# Patient Record
Sex: Female | Born: 1970 | Race: White | Hispanic: No | Marital: Married | Smoking: Former smoker
Health system: Southern US, Community
[De-identification: ages and names within clinical notes are randomized; demographics above are authoritative.]

## PROBLEM LIST (undated history)

## (undated) DIAGNOSIS — E7800 Pure hypercholesterolemia, unspecified: Secondary | ICD-10-CM

## (undated) DIAGNOSIS — I10 Essential (primary) hypertension: Secondary | ICD-10-CM

## (undated) DIAGNOSIS — C539 Malignant neoplasm of cervix uteri, unspecified: Secondary | ICD-10-CM

## (undated) HISTORY — PX: HAND SURGERY: SHX662

## (undated) HISTORY — PX: CARPAL TUNNEL RELEASE: SHX101

## (undated) HISTORY — PX: ABDOMINAL HYSTERECTOMY: SHX81

---

## 2005-05-24 ENCOUNTER — Ambulatory Visit: Payer: Self-pay | Admitting: Cardiology

## 2005-05-31 ENCOUNTER — Ambulatory Visit: Payer: Self-pay

## 2005-08-07 ENCOUNTER — Encounter (INDEPENDENT_AMBULATORY_CARE_PROVIDER_SITE_OTHER): Payer: Self-pay | Admitting: Specialist

## 2005-08-08 ENCOUNTER — Observation Stay (HOSPITAL_COMMUNITY): Admission: RE | Admit: 2005-08-08 | Discharge: 2005-08-09 | Payer: Self-pay | Admitting: Obstetrics and Gynecology

## 2005-09-01 ENCOUNTER — Emergency Department (HOSPITAL_COMMUNITY): Admission: EM | Admit: 2005-09-01 | Discharge: 2005-09-01 | Payer: Self-pay | Admitting: Family Medicine

## 2006-01-18 ENCOUNTER — Emergency Department (HOSPITAL_COMMUNITY): Admission: EM | Admit: 2006-01-18 | Discharge: 2006-01-19 | Payer: Self-pay | Admitting: Emergency Medicine

## 2006-10-03 ENCOUNTER — Ambulatory Visit (HOSPITAL_BASED_OUTPATIENT_CLINIC_OR_DEPARTMENT_OTHER): Admission: RE | Admit: 2006-10-03 | Discharge: 2006-10-03 | Payer: Self-pay | Admitting: Orthopedic Surgery

## 2007-03-08 ENCOUNTER — Emergency Department (HOSPITAL_COMMUNITY): Admission: EM | Admit: 2007-03-08 | Discharge: 2007-03-08 | Payer: Self-pay | Admitting: Family Medicine

## 2007-10-26 ENCOUNTER — Emergency Department (HOSPITAL_COMMUNITY): Admission: EM | Admit: 2007-10-26 | Discharge: 2007-10-26 | Payer: Self-pay | Admitting: Emergency Medicine

## 2008-04-26 ENCOUNTER — Ambulatory Visit (HOSPITAL_BASED_OUTPATIENT_CLINIC_OR_DEPARTMENT_OTHER): Admission: RE | Admit: 2008-04-26 | Discharge: 2008-04-26 | Payer: Self-pay | Admitting: Orthopedic Surgery

## 2008-04-26 ENCOUNTER — Encounter (INDEPENDENT_AMBULATORY_CARE_PROVIDER_SITE_OTHER): Payer: Self-pay | Admitting: Orthopedic Surgery

## 2008-11-21 ENCOUNTER — Emergency Department (HOSPITAL_COMMUNITY): Admission: EM | Admit: 2008-11-21 | Discharge: 2008-11-21 | Payer: Self-pay | Admitting: Emergency Medicine

## 2009-07-21 ENCOUNTER — Emergency Department (HOSPITAL_BASED_OUTPATIENT_CLINIC_OR_DEPARTMENT_OTHER): Admission: EM | Admit: 2009-07-21 | Discharge: 2009-07-21 | Payer: Self-pay | Admitting: Emergency Medicine

## 2009-08-24 ENCOUNTER — Encounter (INDEPENDENT_AMBULATORY_CARE_PROVIDER_SITE_OTHER): Payer: Self-pay | Admitting: *Deleted

## 2009-08-30 ENCOUNTER — Encounter: Admission: RE | Admit: 2009-08-30 | Discharge: 2009-08-30 | Payer: Self-pay | Admitting: Gastroenterology

## 2009-08-30 ENCOUNTER — Encounter (INDEPENDENT_AMBULATORY_CARE_PROVIDER_SITE_OTHER): Payer: Self-pay | Admitting: *Deleted

## 2009-12-04 ENCOUNTER — Emergency Department (HOSPITAL_COMMUNITY): Admission: EM | Admit: 2009-12-04 | Discharge: 2009-12-04 | Payer: Self-pay | Admitting: Family Medicine

## 2010-03-18 ENCOUNTER — Ambulatory Visit: Payer: Self-pay | Admitting: Diagnostic Radiology

## 2010-03-18 ENCOUNTER — Emergency Department (HOSPITAL_BASED_OUTPATIENT_CLINIC_OR_DEPARTMENT_OTHER): Admission: EM | Admit: 2010-03-18 | Discharge: 2010-03-18 | Payer: Self-pay | Admitting: Emergency Medicine

## 2010-10-17 ENCOUNTER — Other Ambulatory Visit: Payer: Self-pay | Admitting: Obstetrics and Gynecology

## 2010-11-17 LAB — DIFFERENTIAL
Lymphocytes Relative: 28 % (ref 12–46)
Neutrophils Relative %: 63 % (ref 43–77)

## 2010-11-17 LAB — CBC
HCT: 37.6 % (ref 36.0–46.0)
MCH: 30 pg (ref 26.0–34.0)
MCHC: 34.3 g/dL (ref 30.0–36.0)
Platelets: 346 10*3/uL (ref 150–400)
RBC: 4.3 MIL/uL (ref 3.87–5.11)
RDW: 12.1 % (ref 11.5–15.5)

## 2010-11-17 LAB — URINALYSIS, ROUTINE W REFLEX MICROSCOPIC
Leukocytes, UA: NEGATIVE
Nitrite: NEGATIVE
Protein, ur: 100 mg/dL — AB
Specific Gravity, Urine: 1.018 (ref 1.005–1.030)
Urobilinogen, UA: 0.2 mg/dL (ref 0.0–1.0)

## 2010-11-17 LAB — BASIC METABOLIC PANEL
BUN: 14 mg/dL (ref 6–23)
CO2: 25 mEq/L (ref 19–32)
GFR calc Af Amer: >60 mL/min (ref >60)
Glucose, Bld: 133 mg/dL — ABNORMAL HIGH (ref 70–99)
Potassium: 3.8 mEq/L (ref 3.5–5.1)
Sodium: 144 mEq/L (ref 135–145)

## 2010-11-17 LAB — URINE MICROSCOPIC-ADD ON

## 2010-11-17 LAB — PREGNANCY, URINE: Preg Test, Ur: NEGATIVE

## 2010-11-17 LAB — HEPATIC FUNCTION PANEL: Albumin: 4.5 g/dL (ref 3.5–5.2)

## 2010-11-21 LAB — HERPES SIMPLEX VIRUS CULTURE

## 2010-12-05 LAB — CBC
HCT: 37.6 % (ref 36.0–46.0)
Hemoglobin: 13.3 g/dL (ref 12.0–15.0)
MCHC: 35.4 g/dL (ref 30.0–36.0)
MCV: 86.9 fL (ref 78.0–100.0)
Platelets: 239 10*3/uL (ref 150–400)

## 2010-12-05 LAB — URINALYSIS, ROUTINE W REFLEX MICROSCOPIC
Glucose, UA: NEGATIVE mg/dL
Ketones, ur: NEGATIVE mg/dL
Specific Gravity, Urine: 1.005 (ref 1.005–1.030)

## 2010-12-05 LAB — COMPREHENSIVE METABOLIC PANEL
ALT: 38 U/L — ABNORMAL HIGH (ref 0–35)
Albumin: 4.6 g/dL (ref 3.5–5.2)
BUN: 10 mg/dL (ref 6–23)
CO2: 23 mEq/L (ref 19–32)
Creatinine, Ser: 0.6 mg/dL (ref 0.4–1.2)
Glucose, Bld: 158 mg/dL — ABNORMAL HIGH (ref 70–99)
Potassium: 3.8 mEq/L (ref 3.5–5.1)
Sodium: 139 mEq/L (ref 135–145)

## 2010-12-05 LAB — DIFFERENTIAL
Eosinophils Absolute: 0 10*3/uL (ref 0.0–0.7)
Lymphs Abs: 0.7 10*3/uL (ref 0.7–4.0)
Monocytes Absolute: 0.6 10*3/uL (ref 0.1–1.0)
Neutro Abs: 9 10*3/uL — ABNORMAL HIGH (ref 1.7–7.7)
Neutrophils Relative %: 86 % — ABNORMAL HIGH (ref 43–77)

## 2010-12-13 LAB — POCT I-STAT, CHEM 8
BUN: 5 mg/dL — ABNORMAL LOW (ref 6–23)
Chloride: 105 mEq/L (ref 96–112)
Glucose, Bld: 109 mg/dL — ABNORMAL HIGH (ref 70–99)
Hemoglobin: 13.3 g/dL (ref 12.0–15.0)

## 2010-12-13 LAB — CBC
Hemoglobin: 13.4 g/dL (ref 12.0–15.0)
MCHC: 34.9 g/dL (ref 30.0–36.0)
MCV: 86.9 fL (ref 78.0–100.0)
RDW: 13 % (ref 11.5–15.5)
WBC: 9.2 10*3/uL (ref 4.0–10.5)

## 2010-12-13 LAB — DIFFERENTIAL
Basophils Absolute: 0.1 10*3/uL (ref 0.0–0.1)
Lymphocytes Relative: 27 % (ref 12–46)
Lymphs Abs: 2.5 10*3/uL (ref 0.7–4.0)
Neutro Abs: 5.8 10*3/uL (ref 1.7–7.7)
Neutrophils Relative %: 62 % (ref 43–77)

## 2010-12-13 LAB — URINALYSIS, ROUTINE W REFLEX MICROSCOPIC
Bilirubin Urine: NEGATIVE
Ketones, ur: NEGATIVE mg/dL
Nitrite: NEGATIVE
Protein, ur: NEGATIVE mg/dL

## 2011-01-07 ENCOUNTER — Emergency Department (HOSPITAL_COMMUNITY)
Admission: EM | Admit: 2011-01-07 | Discharge: 2011-01-07 | Disposition: A | Discharge status: Home / Self Care | Payer: 59 | Attending: Emergency Medicine | Admitting: Emergency Medicine

## 2011-01-07 ENCOUNTER — Emergency Department (HOSPITAL_COMMUNITY): Payer: 59

## 2011-01-07 DIAGNOSIS — Z79899 Other long term (current) drug therapy: Secondary | ICD-10-CM | POA: Insufficient documentation

## 2011-01-07 DIAGNOSIS — R0609 Other forms of dyspnea: Secondary | ICD-10-CM | POA: Insufficient documentation

## 2011-01-07 DIAGNOSIS — I10 Essential (primary) hypertension: Secondary | ICD-10-CM | POA: Insufficient documentation

## 2011-01-07 DIAGNOSIS — R002 Palpitations: Secondary | ICD-10-CM | POA: Insufficient documentation

## 2011-01-07 DIAGNOSIS — R110 Nausea: Secondary | ICD-10-CM | POA: Insufficient documentation

## 2011-01-07 DIAGNOSIS — E119 Type 2 diabetes mellitus without complications: Secondary | ICD-10-CM | POA: Insufficient documentation

## 2011-01-07 DIAGNOSIS — M542 Cervicalgia: Secondary | ICD-10-CM | POA: Insufficient documentation

## 2011-01-07 DIAGNOSIS — R0989 Other specified symptoms and signs involving the circulatory and respiratory systems: Secondary | ICD-10-CM | POA: Insufficient documentation

## 2011-01-07 DIAGNOSIS — M79609 Pain in unspecified limb: Secondary | ICD-10-CM | POA: Insufficient documentation

## 2011-01-07 DIAGNOSIS — R079 Chest pain, unspecified: Secondary | ICD-10-CM | POA: Insufficient documentation

## 2011-01-07 LAB — URINALYSIS, ROUTINE W REFLEX MICROSCOPIC
Ketones, ur: NEGATIVE mg/dL
Nitrite: NEGATIVE
Protein, ur: NEGATIVE mg/dL
pH: 7 (ref 5.0–8.0)

## 2011-01-07 LAB — CBC
Platelets: 332 10*3/uL (ref 150–400)
RDW: 12.8 % (ref 11.5–15.5)
WBC: 9.6 10*3/uL (ref 4.0–10.5)

## 2011-01-07 LAB — DIFFERENTIAL
Basophils Absolute: 0.1 10*3/uL (ref 0.0–0.1)
Eosinophils Absolute: 0.2 10*3/uL (ref 0.0–0.7)
Eosinophils Relative: 2 % (ref 0–5)
Lymphocytes Relative: 33 % (ref 12–46)

## 2011-01-07 LAB — POCT CARDIAC MARKERS
CKMB, poc: <1 ng/mL — ABNORMAL LOW (ref 1.0–8.0)
CKMB, poc: <1 ng/mL — ABNORMAL LOW (ref 1.0–8.0)
Myoglobin, poc: 44.6 ng/mL (ref 12–200)
Troponin i, poc: <0.05 ng/mL (ref 0.00–0.09)

## 2011-01-07 LAB — COMPREHENSIVE METABOLIC PANEL
BUN: 9 mg/dL (ref 6–23)
Calcium: 9.8 mg/dL (ref 8.4–10.5)
Glucose, Bld: 111 mg/dL — ABNORMAL HIGH (ref 70–99)
Total Protein: 7.3 g/dL (ref 6.0–8.3)

## 2011-01-07 LAB — LIPASE, BLOOD: Lipase: 41 U/L (ref 11–59)

## 2011-01-15 NOTE — Op Note (Signed)
Marie Roberts, Marie Roberts               ACCOUNT NO.:  [id number]   MEDICAL RECORD NO.:  [id number]          PATIENT TYPE:  AMB   LOCATION:  DSC                          FACILITY:  [facility]   PHYSICIAN:  Katy Fitch. Sypher, M.D. 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   POSTOPERATIVE DIAGNOSES:  1. Avascular necrosis, proximal pole, left scaphoid.  2. Grade 4 chondromalacia, proximal pole of scaphoid, lunate, and      central portion of triquetrum with intact hyaline articular      cartilage surfaces of proximal capitate and lunate leading to      decision to proceed with proximal row carpectomy as a salvage      rather than a scaphoid excision and ulnar four-bone fusion      procedure.   OPERATION:  Exploration of left radiocarpal, ulnocarpal, and midcarpal  joints followed by identification of significant radiolunate arthrosis  and proximal triquetral arthrosis aborting the decision to proceed with  a scaphoid excision and ulnar four-bone fusion.  Ultimately, a proximal  row carpectomy is performed with synovectomy and posterior interosseous  neurectomy of the left wrist.   OPERATING SURGEON:  Katy Fitch. Sypher, MD   ASSISTANT:  Marveen Reeks Dasnoit, PA-C   ANESTHESIA:  General by LMA.   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  She has been a long-term patient.  She is status  post prior carpal tunnel release.  She returned with increasing wrist  pain and on clinical examination, was noted to have loss of range of  motion.  Followup electrodiagnostic studies on December 02, 2007, revealed  no evidence of carpal tunnel  syndrome.  X-rays of the wrist documented a  chronic scaphoid nonunion and advanced collapse deformity of the left  wrist.   She has failed nonoperative measures including splinting, activity  modification, and anti-inflammatory medication.  Due to failure to  respond to these measures, she is brought to the operating at this time.   Given the pattern of her x-ray findings, we initially recommended a  scaphoid excision and ulnar four-bone fusion.  She was advised, however,  that we would address her pathology appropriately for the findings noted  at surgery.   After informed consent, she is brought to the operating at this time.   PROCEDURE:  Marie Roberts was brought to the operating room and  placed in supine position on the operating table.   Following the induction of general anesthesia by LMA technique under Dr.  Edison Pace direct supervision, the left arm was prepped with Betadine  soap solution and sterilely draped.  Ancef 1g was administered as an IV  prophylactic antibiotic.   The left arm was prepped with Betadine soap solution and sterilely  draped.  Following exsanguination of left arm with  Esmarch bandage, an  arterial tourniquet on the proximal brachium was inflated to 220 mmHg.   Procedure commenced with a standard dorsal incision extending from the  base of the long finger metacarpal to Lister tubercle ulnar aspect.  The  subcutaneous tissues were carefully divided taking care to spare the  dorsal veins.  The extensor retinaculum was split over 1 cm distance and  the extensors retracted in a ulnar direction.  A transverse capsulotomy  was performed exposing the scaphoid nonunion.  The proximal pole of the  scaphoid was saponified and avascular.  The proximal pole was removed as  a single fragment with a rongeur.  The distal pole was also removed as a  single fragment after release of ligaments with a curved periosteal  elevator.  The proximal pole of the  scaphoid was denuded of hyaline  cartilage and there was considerable hyaline cartilage loss on the  distal pole of the scaphoid.  The lunate facet of the radius was  examined and found to have intact hyaline cartilage.  The lunate was  carefully examined and the dorsal half of the lunate had grade 4  chondromalacia with eburnated bone.  The proximal pole of the capitate  had intact hyaline cartilage on its load-bearing surface.  The facet  that had articulated with the proximal pole of the scaphoid dorsally did  have grade 3 chondromalacia.  The proximal aspect of the triquetrum was  inspected and found to have grade 4 chondromalacia over 40% of its  surface proximally.   Given these circumstances, the approach of using a ulnar four-bone  fusion was no longer valid.   Given the circumstance, we proceeded to a proximal row carpectomy.   The lunate and triquetrum were morcellized and removed in piecemeal.  A  complete synovectomy accomplished followed by hemostasis.  After  irrigation, the wound was inspected for bleeding points, which were  electrocauterized with bipolar current followed by repair of the capsule  with the capsule invagination with mattress sutures of 3-0 Ethibond.  The extensor retinaculum was repaired anatomically with mattress sutures  of 3-0 Ethibond followed by repair of the skin with subcutaneous sutures  of 4-0 Vicryl and intradermal through a Prolene.   A compressive dressing was applied with a sugar-tong splint maintaining  the wrist in neutral position.   Ms. Cordoba will be discharged home with prescriptions for Dilaudid 2 mg  1-2 tablets p.o. q.4-6 h. p.r.n. pain, 30 tablets without refill.  Also,  Motrin 600 mg 1 p.o. q.6 h. p.r.n. pain, 30 tablets with 1 refill, and  Keflex 500 mg 1 p.o. q.8 h. x4 days as a prophylactic antibiotic.      Katy Fitch Sypher, M.D.  Electronically Signed     RVS/MEDQ  D:  04/26/2008  T:  04/27/2008  Job:  [id number]

## 2011-01-18 NOTE — Op Note (Signed)
[name], [name]               ACCOUNT NO.:  [id number]   MEDICAL RECORD NO.:  [id number]          PATIENT TYPE:  AMB   LOCATION:  DSC                          FACILITY:  [facility]   PHYSICIAN:  Katy Fitch. Sypher, M.D. 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   OPERATION:  Release of right transverse carpal ligament.   OPERATING SURGEON:  Katy Fitch. Sypher, M.D.   ASSISTANT:  Molly Maduro Dasnoit PA-C.   ANESTHESIA:  General by LMA.   SUPERVISING ANESTHESIOLOGIST:  Dr. Jean Rosenthal.   INDICATIONS:  Marie Roberts is a 40 year old woman referred for  evaluation and management of hand numbness.  Clinical examination  revealed signs of carpal tunnel syndrome.  Electrodiagnostic studies  were completed and revealed evidence of moderately severe carpal tunnel  syndrome.   She is now brought to the operating room anticipating release of her  right transverse carpal ligament.   PROCEDURE:  [name] is brought to the operating room and placed in  the supine position on the operating table.  Following induction of  general anesthesia by LMA technique the right arm was prepped with  Betadine soap and solution and sterilely draped.  Following  exsanguination of the right arm with an Esmarch bandage, the arterial  tourniquet on the proximal brachium was inflated to 220 mmHg.   Procedure commenced with a short incision in the line of the ring finger  on the palm.  Subcutaneous tissues were carefully divided revealing the  palmar fascia.  This was split longitudinally to reveal the common  sensory branch of the median nerve.  These were followed back to the  transverse carpal ligament which was gently isolated from the median  nerve.  The  ligament was released along its ulnar border extending into  the distal forearm.  This widely opened the carpal canal.  No masses or  __________  noted.  Bleeding points along margin of the released  ligament were electrocauterized with bipolar current followed by repair  of the skin with intradermal 3-0 Prolene suture.   A compressive dressing was applied with a volar plaster splint  maintaining the wrist in 5 degrees of dorsiflexion.  For aftercare Ms.  Durkin is provided a prescription for Percocet 5 mg one p.o. q.4-6 hours  p.r.n. pain, 20 tablets without refill.   I will see her back for followup in the office in 6-8 days for dressing  change and initiation of range of motion program.      Katy Fitch. Sypher, M.D.  Electronically Signed     RVS/MEDQ  D:  10/03/2006  T:  10/03/2006  Job:  [id number]

## 2011-01-18 NOTE — H&P (Signed)
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   HISTORY OF PRESENT ILLNESS:  She is a 40 year old white female, G2, P2,  history of vaginal hysteroscopy x2 with biopsy-proven CIN3 who wants to  proceed with laparoscopic vaginal hysterectomy due to complaints of  irregular bleeding and abnormal Pap smear.   MEDICATIONS:  Wellbutrin and multivitamin.   ALLERGIES:  She has no known drug allergies.   She has a history of two vaginal deliveries and a tubal ligation. She is a  nonsmoker and nondrinker. She denies domestic or physical violence.   PHYSICAL EXAMINATION:  GENERAL:  She is an obese white female in no acute  distress.  HEENT:  Normal.  LUNGS:  Clear.  HEART:  Regular rate and rhythm.  ABDOMEN:  Soft. Nontender.  PELVIC:  Reveals a 6 to 8-week size uterus. No adnexal masses.  EXTREMITIES:  No cords.  NEUROLOGICAL:  Nonfocal.   IMPRESSION:  1.  Carcinoma in situ.  2.  Dysfunctional uterine bleeding for definitive therapy.   PLAN:  Proceed with laparoscopic assisted vaginal hysterectomy. The risks of  anesthesia, infection, bleeding, injury to abdominal organs, and need for  repair was discussed. Delayed versus immediate complications to include  bowel and bladder injury are noted. The patient acknowledges and wishes to  proceed.      Lenoard Aden, M.D.  Electronically Signed     RJT/MEDQ  D:  08/07/2005  T:  08/07/2005  Job:  [id number]

## 2011-01-18 NOTE — Op Note (Signed)
NAMESU, DUMA               ACCOUNT NO.:  [id number]   MEDICAL RECORD NO.:  [id number]          PATIENT TYPE:  OBV   LOCATION:  9399                          FACILITY:  [facility]   PHYSICIAN:  Lenoard Aden, M.D.DATE OF BIRTH:  Jan 11, 1971   DATE OF PROCEDURE:  08/08/2005  DATE OF DISCHARGE:                                 OPERATIVE REPORT   PREOPERATIVE DIAGNOSES:  1.  Cervical carcinoma in situ.  2.  Symptomatic uterine fibroids.  3.  Dysfunctional uterine bleeding.   POSTOPERATIVE DIAGNOSES:  1.  Cervical carcinoma in situ.  2.  Symptomatic uterine fibroids.  3.  Dysfunctional uterine bleeding.  4.  Enterocele.   PROCEDURE:  Laparoscopically-assisted vaginal hysterectomy with Rogelio Seen  culdoplasty.   SURGEON:  Dr. Billy Coast   ASSISTANT:  Dr. Earlene Plater   ANESTHESIA:  General.   ESTIMATED BLOOD LOSS:  100 mL.   COMPLICATIONS:  None.   DRAINS:  Foley.   COUNTS:  Correct.   DISPOSITION:  Patient to recovery in good condition.   BRIEF OPERATIVE NOTE:  After being apprised of the risks of anesthesia,  infection, bleeding, intra-abdominal organs, need for repair, delayed versus  immediate complications to include bowel and bladder injury, the patient is  brought to the operating room where she is administered a general anesthetic  without complications, prepped and draped in the usual sterile fashion, a  Foley catheter placed.  After achieving adequate anesthesia, dilute, a Hulka  tenaculum placed per vagina, infraumbilical incision made with a scalpel.  Veress needle placed, opening pressure -2 noted __________ with CO2  insufflated without difficulty, trocar placed, atraumatic trocar entry  noted.  Visualization reveals bulky, fibroid-laden uterus, normal tubes,  normal ovaries, and normal liver, gallbladder, normal appendiceal area,  pictures taken.  Two 5 mm trocar sites made suprapubically in the mid  axillary line under transillumination, direct placement at this  time.  After  placement, the uterus is retracted, and anterior/posterior cul-de-sacs are  clear.  Round ligaments are bilaterally ligated and divided using the  tripolar.  The tuboovarian round ligaments are divided similarly.  Bladder  flap is developed atraumatically posteriorly.  Leaf of the broad ligament is  developed atraumatically.  The uterine vessels are identified, cauterized  bilaterally without difficulty.  At this time, the same procedure is  performed on the left side as performed on the right.  Enterocele is  identified during this procedure at this time.  Attention is turned to the  vaginal portion of the procedure whereby a weighted speculum is place.  Cervix is grasped anteriorly and posteriorly using Jacob tenaculum.  Cervicovaginal junction is scored using electrocautery, posterior entry made  without difficulty.  The uterosacrals are bilaterally clamped, suture  ligated, transfixed at vaginal cuff.  Long weighted speculum was placed.  The cardinal and broad ligament complexes are isolated and divided using the  LigaSure.  Anterior/posterior/ cul-de-sac entry made atraumatically,  retractor placed.  The uterine vessels are identified bilaterally and  divided using LigaSure, specimen removed.  McCall culdoplasty performed  using an internal and external suture with 0 Vicryl.  Good hemostasis  is  noted.  Cuff is closed side-to-side, front-to-back with interrupted 0 Vicryl  sutures, packing placed.  Attention thereby turned to the abdominal portion  of the procedure whereby the visualization reveals a minimal amount of  oozing from the cuff which is dealt with using the tripolar, and well-  controlled irrigation is accomplished.  All instruments are then removed  under direct visualization; CO2 is released.  Infraumbilical incision closed  with 0 Vicryl and Dermabond.  Two suprapubic incisions are closed using  Dermabond.  The patient tolerates the procedure well and is  transferred to  recovery in good condition.      Lenoard Aden, M.D.  Electronically Signed     RJT/MEDQ  D:  08/08/2005  T:  08/08/2005  Job:  [id number]

## 2011-01-29 ENCOUNTER — Inpatient Hospital Stay (INDEPENDENT_AMBULATORY_CARE_PROVIDER_SITE_OTHER)
Admission: RE | Admit: 2011-01-29 | Discharge: 2011-01-29 | Disposition: A | Discharge status: Home / Self Care | Payer: 59 | Source: Ambulatory Visit

## 2011-01-29 DIAGNOSIS — T7840XA Allergy, unspecified, initial encounter: Secondary | ICD-10-CM

## 2011-01-29 DIAGNOSIS — L989 Disorder of the skin and subcutaneous tissue, unspecified: Secondary | ICD-10-CM

## 2011-10-25 ENCOUNTER — Emergency Department (HOSPITAL_BASED_OUTPATIENT_CLINIC_OR_DEPARTMENT_OTHER)
Admission: EM | Admit: 2011-10-25 | Discharge: 2011-10-25 | Disposition: A | Discharge status: Home Health | Payer: 59 | Attending: Emergency Medicine | Admitting: Emergency Medicine

## 2011-10-25 ENCOUNTER — Encounter (HOSPITAL_BASED_OUTPATIENT_CLINIC_OR_DEPARTMENT_OTHER): Payer: Self-pay

## 2011-10-25 DIAGNOSIS — M25519 Pain in unspecified shoulder: Secondary | ICD-10-CM | POA: Insufficient documentation

## 2011-10-25 DIAGNOSIS — E119 Type 2 diabetes mellitus without complications: Secondary | ICD-10-CM | POA: Insufficient documentation

## 2011-10-25 DIAGNOSIS — M62838 Other muscle spasm: Secondary | ICD-10-CM

## 2011-10-25 DIAGNOSIS — I10 Essential (primary) hypertension: Secondary | ICD-10-CM | POA: Insufficient documentation

## 2011-10-25 DIAGNOSIS — E7800 Pure hypercholesterolemia, unspecified: Secondary | ICD-10-CM | POA: Insufficient documentation

## 2011-10-25 HISTORY — DX: Essential (primary) hypertension: I10

## 2011-10-25 HISTORY — DX: Malignant neoplasm of cervix uteri, unspecified: C53.9

## 2011-10-25 HISTORY — DX: Pure hypercholesterolemia, unspecified: E78.00

## 2011-10-25 MED ORDER — CYCLOBENZAPRINE HCL 10 MG PO TABS: 10.0000 mg | ORAL_TABLET | Freq: Two times a day (BID) | ORAL | Status: AC | PRN

## 2011-10-25 MED ORDER — OXYCODONE-ACETAMINOPHEN 5-325 MG PO TABS: 1.0000 | ORAL_TABLET | Freq: Four times a day (QID) | ORAL | Status: AC | PRN

## 2011-10-25 NOTE — Discharge Instructions (Signed)
Muscle Cramps Muscle cramps are when muscles tighten by themselves. Muscle cramps usually improve or go away within minutes. HOME CARE  Massage the muscle.   Stretch the muscle.   Relax the muscle.   Only take medicine as told by your doctor.   Drink enough fluids to keep your pee (urine) clear or pale yellow.  GET HELP RIGHT AWAY IF:  Cramps are frequent and do not get better with medicine. MAKE SURE YOU:  Understand these intructions.   Will watch your condition.   Will get help right away if your are not doing well or get worse.  Document Released: 08/01/2008 Document Revised: 05/01/2011 Document Reviewed: 08/10/2008 [employer] Patient Information 2012 [employer]. 

## 2011-10-25 NOTE — ED Provider Notes (Signed)
History     CSN: [id number]  Arrival date & time 10/25/11  1039   First MD Initiated Contact with Patient 10/25/11 1110      Chief Complaint  Patient presents with  . Shoulder Pain    (Consider location/radiation/quality/duration/timing/severity/associated sxs/prior treatment) Patient is a 41 y.o. female presenting with shoulder pain. The history is provided by the patient.  Shoulder Pain This is a new problem. The current episode started 2 days ago. The problem occurs constantly. The problem has been gradually worsening. Pertinent negatives include no chest pain and no shortness of breath. Exacerbated by: The arm in certain positions and palpation. The symptoms are relieved by nothing. She has tried rest, acetaminophen, a warm compress and water for the symptoms. The treatment provided mild relief.    Past Medical History  Diagnosis Date  . Diabetes mellitus   . Hypertension   . High cholesterol   . Cervical cancer     Past Surgical History  Procedure Date  . Abdominal hysterectomy   . Hand surgery   . Carpal tunnel release     No family history on file.  History  Substance Use Topics  . Smoking status: Never Smoker   . Smokeless tobacco: Not on file  . Alcohol Use: No    OB History    Grav Para Term Preterm Abortions TAB SAB Ect Mult Living                  Review of Systems  Respiratory: Negative for shortness of breath.   Cardiovascular: Negative for chest pain.  All other systems reviewed and are negative.    Allergies  Review of patient's allergies indicates no known allergies.  Home Medications   Current Outpatient Rx  Name Route Sig Dispense Refill  . AMOXIL PO Oral Take by mouth.    Lestine Mount PO Oral Take by mouth.    . MOMETASONE FUROATE 50 MCG/ACT NA SUSP Nasal Place 2 sprays into the nose daily.    Marland Kitchen BENICAR PO Oral Take by mouth.    Marland Kitchen KOMBIGLYZE XR PO Oral Take by mouth.      BP 149/82  Pulse 76  Temp(Src) 98.6 F (37 C) (Oral)   Resp 16  Ht 5\' 2"  (1.575 m)  Wt 207 lb (93.895 kg)  BMI 37.86 kg/m2  SpO2 100%  Physical Exam  Nursing note and vitals reviewed. Constitutional: She is oriented to person, place, and time. She appears well-developed and well-nourished. No distress.  HENT:  Head: Normocephalic and atraumatic.  Eyes: EOM are normal. Pupils are equal, round, and reactive to light.  Musculoskeletal: She exhibits tenderness.       Arms: Neurological: She is alert and oriented to person, place, and time. She has normal strength. No sensory deficit.  Skin: Skin is warm and dry. No rash noted. No erythema.  Psychiatric: She has a normal mood and affect. Her behavior is normal.    ED Course  Procedures (including critical care time)  Labs Reviewed - No data to display No results found.   1. Trapezius muscle spasm       MDM   Shoulder pain in the right in the trapezius distribution.  Neurovascularly intact. Normal pulse and sensation. Good capillary refill. Initially some mild pain in them this weekend. Around her granddaughter which is what exacerbated her symptoms. Denies any cough, shortness of breath or chest pain.        Gwyneth Sprout, MD 10/25/11 1150

## 2011-10-25 NOTE — ED Notes (Signed)
C/o pain to right shoulder "for a couple of weeks"-pain wporse after carrying grand daughter

## 2013-06-02 ENCOUNTER — Other Ambulatory Visit: Payer: Self-pay | Admitting: Obstetrics and Gynecology

## 2015-04-27 DIAGNOSIS — I10 Essential (primary) hypertension: Secondary | ICD-10-CM | POA: Insufficient documentation

## 2015-04-27 DIAGNOSIS — E119 Type 2 diabetes mellitus without complications: Secondary | ICD-10-CM | POA: Insufficient documentation

## 2016-01-16 LAB — HM DIABETES EYE EXAM

## 2016-03-07 ENCOUNTER — Ambulatory Visit: Payer: Self-pay | Admitting: Skilled Nursing Facility1

## 2016-03-28 ENCOUNTER — Ambulatory Visit: Payer: Self-pay | Admitting: Dietician

## 2016-05-22 ENCOUNTER — Encounter: Payer: Self-pay | Admitting: Physician Assistant

## 2016-05-22 ENCOUNTER — Ambulatory Visit (INDEPENDENT_AMBULATORY_CARE_PROVIDER_SITE_OTHER): Payer: 59 | Admitting: Physician Assistant

## 2016-05-22 VITALS — BP 114/72 | HR 75 | Temp 98.0°F | Ht 62.0 in | Wt 190.4 lb

## 2016-05-22 DIAGNOSIS — G4733 Obstructive sleep apnea (adult) (pediatric): Secondary | ICD-10-CM | POA: Diagnosis not present

## 2016-05-22 DIAGNOSIS — E785 Hyperlipidemia, unspecified: Secondary | ICD-10-CM | POA: Diagnosis not present

## 2016-05-22 DIAGNOSIS — I10 Essential (primary) hypertension: Secondary | ICD-10-CM | POA: Diagnosis not present

## 2016-05-22 DIAGNOSIS — E1169 Type 2 diabetes mellitus with other specified complication: Secondary | ICD-10-CM | POA: Diagnosis not present

## 2016-05-22 DIAGNOSIS — IMO0001 Reserved for inherently not codable concepts without codable children: Secondary | ICD-10-CM

## 2016-05-22 DIAGNOSIS — E109 Type 1 diabetes mellitus without complications: Secondary | ICD-10-CM

## 2016-05-22 DIAGNOSIS — Z9989 Dependence on other enabling machines and devices: Secondary | ICD-10-CM

## 2016-05-22 LAB — BAYER DCA HB A1C WAIVED: HB A1C: 8.4 % — AB (ref <7.0)

## 2016-05-22 MED ORDER — PHENTERMINE HCL 37.5 MG PO TABS: 37.5000 mg | ORAL_TABLET | Freq: Every day | ORAL | 2 refills | Status: DC

## 2016-05-22 NOTE — Progress Notes (Signed)
BP 114/72   Pulse 75   Temp 98 F (36.7 C) (Oral)   Ht '5\' 2"'$  (1.575 m)   Wt 190 lb 6.4 oz (86.4 kg)   BMI 34.82 kg/m    Subjective:    Patient ID: Marie Roberts, female    DOB: 1970/09/19, 45 y.o.   MRN: [id number]  Marie Roberts is a 45 y.o. female presenting on 05/22/2016 for screening for work (labs ) and Follow-up  HPI Patient here to be established as new patient at [facility].  This patient is known to me from [facility]. This patient comes in for recheck on several medical conditions and her labs and refills on medications. She has been doing very well with her diet and exercise activity. She is down another 10 pounds since I saw her earlier in the summer. She is continuing take her medications regularly. Her past medical history is positive for non-insulin-dependent diabetes without complication, hyperlipidemia associated with type 2 diabetes, obstructive sleep apnea treated with CPAP, hypertension, allergic rhinitis, back pain, and elevated triglycerides. All of her medications are reviewed and updated.  Relevant past medical, surgical, family and social history reviewed and updated as indicated. Interim medical history since our last visit reviewed. Allergies and medications reviewed and updated.   Data reviewed from any sources in EPIC.  Review of Systems  Constitutional: Negative.  Negative for activity change, fatigue and fever.  HENT: Negative.   Eyes: Negative.   Respiratory: Negative.  Negative for cough, shortness of breath and wheezing.   Cardiovascular: Negative.  Negative for chest pain, palpitations and leg swelling.  Gastrointestinal: Negative.  Negative for abdominal pain.  Endocrine: Negative.   Genitourinary: Negative.  Negative for dysuria.  Musculoskeletal: Positive for back pain, joint swelling and myalgias.  Skin: Negative.   Neurological: Negative.   Psychiatric/Behavioral: Positive for decreased concentration  and dysphoric mood. The patient is nervous/anxious.     Per HPI unless specifically indicated above  Social History   Social History  . Marital status: Married    Spouse name: N/A  . Number of children: N/A  . Years of education: N/A   Occupational History  . Not on file.   Social History Main Topics  . Smoking status: Former Research scientist (life sciences)  . Smokeless tobacco: Never Used  . Alcohol use No  . Drug use: No  . Sexual activity: Not on file   Other Topics Concern  . Not on file   Social History Narrative  . No narrative on file    Past Surgical History:  Procedure Laterality Date  . ABDOMINAL HYSTERECTOMY    . CARPAL TUNNEL RELEASE    . HAND SURGERY      Family History  Problem Relation Age of Onset  . Diabetes Mother   . Diabetes Father       Medication List       Accurate as of 05/22/16  8:49 AM. Always use your most recent med list.          ALPRAZolam 0.5 MG tablet Commonly known as:  XANAX Take 0.5 mg by mouth 2 (two) times daily as needed for anxiety.   BENICAR PO Take 40 mg by mouth daily.   citalopram 40 MG tablet Commonly known as:  CELEXA Take 40 mg by mouth daily.   fenofibrate 160 MG tablet Take 160 mg by mouth daily.   KOMBIGLYZE XR 2.12-998 MG Tb24 Generic drug:  Saxagliptin-Metformin Take by mouth 2 (two) times  daily.   mometasone 50 MCG/ACT nasal spray Commonly known as:  NASONEX Place 2 sprays into the nose daily.   phentermine 37.5 MG tablet Commonly known as:  ADIPEX-P Take 1 tablet (37.5 mg total) by mouth daily before breakfast.          Objective:    BP 114/72   Pulse 75   Temp 98 F (36.7 C) (Oral)   Ht '5\' 2"'$  (1.575 m)   Wt 190 lb 6.4 oz (86.4 kg)   BMI 34.82 kg/m   No Known Allergies Wt Readings from Last 3 Encounters:  05/22/16 190 lb 6.4 oz (86.4 kg)  10/25/11 207 lb (93.9 kg)    Physical Exam  Constitutional: She is oriented to person, place, and time. She appears well-developed and well-nourished.    HENT:  Head: Normocephalic and atraumatic.  Eyes: Conjunctivae and EOM are normal. Pupils are equal, round, and reactive to light.  Neck: Normal range of motion. Neck supple.  Cardiovascular: Normal rate, regular rhythm, normal heart sounds and intact distal pulses.   Pulmonary/Chest: Effort normal and breath sounds normal.  Abdominal: Soft. Bowel sounds are normal.  Neurological: She is alert and oriented to person, place, and time. She has normal reflexes.  Skin: Skin is warm and dry. No rash noted.  Psychiatric: She has a normal mood and affect. Her behavior is normal. Judgment and thought content normal.  Nursing note and vitals reviewed.  Diabetic Foot Exam - Simple   Simple Foot Form Diabetic Foot exam was performed with the following findings:  Yes 05/22/2016 11:37 AM  Visual Inspection No deformities, no ulcerations, no other skin breakdown bilaterally:  Yes Sensation Testing Intact to touch and monofilament testing bilaterally:  Yes Pulse Check Posterior Tibialis and Dorsalis pulse intact bilaterally:  Yes Comments         Assessment & Plan:   1. Essential hypertension Continue medications, reduce salt, walk daily - CMP14+EGFR - CBC with Differential/Platelet  2. Type 1 diabetes mellitus without complication (HCC) Continue medications, watch diet, walk daily - Bayer DCA Hb A1c Waived - CBC with Differential/Platelet  3. OSA on CPAP Continue therapy and monitoring with Dr. Frederick Peers  4. Hyperlipidemia associated with type 2 diabetes mellitus (HCC) Continue medications - Lipid panel  5. Well adult This was not performed today but isn't here for comfort on her well annual labs. - CMP14+EGFR - Lipid panel - CBC with Differential/Platelet - TSH   Continue all other maintenance medications as listed above. Educational handout given for carb counting.  Follow up plan: Return in about 3 months (around 08/21/2016) for diabetes recheck.  Terald Sleeper  PA-C [facility] [address]  [place], [place] [address] [phone]   05/22/2016, 8:49 AM

## 2016-05-22 NOTE — Patient Instructions (Signed)

## 2016-05-23 LAB — CBC WITH DIFFERENTIAL/PLATELET
BASOS: 1 %
Basophils Absolute: 0.1 10*3/uL (ref 0.0–0.2)
EOS (ABSOLUTE): 0.2 10*3/uL (ref 0.0–0.4)
Eos: 2 %
HEMOGLOBIN: 12.5 g/dL (ref 11.1–15.9)
Hematocrit: 39 % (ref 34.0–46.6)
IMMATURE GRANULOCYTES: 0 %
Immature Grans (Abs): 0 10*3/uL (ref 0.0–0.1)
Lymphocytes Absolute: 3.2 10*3/uL — ABNORMAL HIGH (ref 0.7–3.1)
Lymphs: 28 %
MCH: 29.4 pg (ref 26.6–33.0)
MCHC: 32.1 g/dL (ref 31.5–35.7)
MCV: 92 fL (ref 79–97)
MONOS ABS: 0.7 10*3/uL (ref 0.1–0.9)
Monocytes: 6 %
NEUTROS ABS: 7.1 10*3/uL — AB (ref 1.4–7.0)
NEUTROS PCT: 63 %
Platelets: 469 10*3/uL — ABNORMAL HIGH (ref 150–379)
RBC: 4.25 x10E6/uL (ref 3.77–5.28)
RDW: 13.4 % (ref 12.3–15.4)
WBC: 11.4 10*3/uL — ABNORMAL HIGH (ref 3.4–10.8)

## 2016-05-23 LAB — CMP14+EGFR
ALBUMIN: 4.8 g/dL (ref 3.5–5.5)
ALT: 18 IU/L (ref 0–32)
AST: 16 IU/L (ref 0–40)
Albumin/Globulin Ratio: 1.8 (ref 1.2–2.2)
Alkaline Phosphatase: 62 IU/L (ref 39–117)
BUN / CREAT RATIO: 21 (ref 9–23)
BUN: 18 mg/dL (ref 6–24)
Bilirubin Total: 0.3 mg/dL (ref 0.0–1.2)
CALCIUM: 11.3 mg/dL — AB (ref 8.7–10.2)
CO2: 25 mmol/L (ref 18–29)
CREATININE: 0.87 mg/dL (ref 0.57–1.00)
Chloride: 96 mmol/L (ref 96–106)
GFR, EST AFRICAN AMERICAN: 92 mL/min/{1.73_m2} (ref >59)
GFR, EST NON AFRICAN AMERICAN: 80 mL/min/{1.73_m2} (ref >59)
GLOBULIN, TOTAL: 2.6 g/dL (ref 1.5–4.5)
Glucose: 224 mg/dL — ABNORMAL HIGH (ref 65–99)
Potassium: 4.6 mmol/L (ref 3.5–5.2)
SODIUM: 136 mmol/L (ref 134–144)
TOTAL PROTEIN: 7.4 g/dL (ref 6.0–8.5)

## 2016-05-23 LAB — LIPID PANEL
CHOL/HDL RATIO: 5.9 ratio — AB (ref 0.0–4.4)
Cholesterol, Total: 224 mg/dL — ABNORMAL HIGH (ref 100–199)
HDL: 38 mg/dL — ABNORMAL LOW (ref >39)
LDL CALC: 127 mg/dL — AB (ref 0–99)
TRIGLYCERIDES: 294 mg/dL — AB (ref 0–149)
VLDL Cholesterol Cal: 59 mg/dL — ABNORMAL HIGH (ref 5–40)

## 2016-05-23 LAB — TSH: TSH: 1.88 u[IU]/mL (ref 0.450–4.500)

## 2016-05-27 ENCOUNTER — Encounter: Payer: Self-pay | Admitting: Physician Assistant

## 2016-06-25 ENCOUNTER — Other Ambulatory Visit: Payer: Self-pay | Admitting: Physician Assistant

## 2016-08-21 ENCOUNTER — Ambulatory Visit: Payer: 59 | Admitting: Physician Assistant

## 2016-09-03 ENCOUNTER — Other Ambulatory Visit: Payer: Self-pay | Admitting: Physician Assistant

## 2016-09-04 ENCOUNTER — Encounter: Payer: Self-pay | Admitting: Physician Assistant

## 2016-09-04 ENCOUNTER — Ambulatory Visit (INDEPENDENT_AMBULATORY_CARE_PROVIDER_SITE_OTHER): Payer: 59 | Admitting: Physician Assistant

## 2016-09-04 VITALS — BP 117/74 | HR 81 | Temp 99.3°F | Ht 62.0 in | Wt 193.2 lb

## 2016-09-04 DIAGNOSIS — J3089 Other allergic rhinitis: Secondary | ICD-10-CM | POA: Insufficient documentation

## 2016-09-04 DIAGNOSIS — I10 Essential (primary) hypertension: Secondary | ICD-10-CM | POA: Diagnosis not present

## 2016-09-04 DIAGNOSIS — E781 Pure hyperglyceridemia: Secondary | ICD-10-CM | POA: Diagnosis not present

## 2016-09-04 DIAGNOSIS — F411 Generalized anxiety disorder: Secondary | ICD-10-CM | POA: Diagnosis not present

## 2016-09-04 DIAGNOSIS — E109 Type 1 diabetes mellitus without complications: Secondary | ICD-10-CM | POA: Diagnosis not present

## 2016-09-04 LAB — BAYER DCA HB A1C WAIVED: HB A1C (BAYER DCA - WAIVED): 8.7 % — ABNORMAL HIGH (ref <7.0)

## 2016-09-04 MED ORDER — ALPRAZOLAM 0.5 MG PO TABS: 0.5000 mg | ORAL_TABLET | Freq: Two times a day (BID) | ORAL | 2 refills | Status: DC | PRN

## 2016-09-04 MED ORDER — SAXAGLIPTIN-METFORMIN ER 2.5-1000 MG PO TB24: 2.5000 mg | ORAL_TABLET | Freq: Two times a day (BID) | ORAL | 6 refills | Status: DC

## 2016-09-04 MED ORDER — FENOFIBRATE 160 MG PO TABS: 160.0000 mg | ORAL_TABLET | Freq: Every day | ORAL | 11 refills | Status: DC

## 2016-09-04 MED ORDER — HYDROCHLOROTHIAZIDE 25 MG PO TABS: 25.0000 mg | ORAL_TABLET | Freq: Every day | ORAL | 3 refills | Status: DC

## 2016-09-04 MED ORDER — OLMESARTAN MEDOXOMIL 40 MG PO TABS: 40.0000 mg | ORAL_TABLET | Freq: Every day | ORAL | 11 refills | Status: DC

## 2016-09-04 MED ORDER — CITALOPRAM HYDROBROMIDE 40 MG PO TABS: 40.0000 mg | ORAL_TABLET | Freq: Every day | ORAL | 11 refills | Status: DC

## 2016-09-04 MED ORDER — PHENTERMINE HCL 37.5 MG PO TABS: 37.5000 mg | ORAL_TABLET | Freq: Every day | ORAL | 2 refills | Status: DC

## 2016-09-04 MED ORDER — MOMETASONE FUROATE 50 MCG/ACT NA SUSP: 2.0000 | Freq: Every day | NASAL | 11 refills | Status: DC

## 2016-09-04 NOTE — Patient Instructions (Signed)
DASH Eating Plan DASH stands for "Dietary Approaches to Stop Hypertension." The DASH eating plan is a healthy eating plan that has been shown to reduce high blood pressure (hypertension). Additional health benefits may include reducing the risk of type 2 diabetes mellitus, heart disease, and stroke. The DASH eating plan may also help with weight loss. What do I need to know about the DASH eating plan? For the DASH eating plan, you will follow these general guidelines:  Choose foods with less than 150 milligrams of sodium per serving (as listed on the food label).  Use salt-free seasonings or herbs instead of table salt or sea salt.  Check with your health care provider or pharmacist before using salt substitutes.  Eat lower-sodium products. These are often labeled as "low-sodium" or "no salt added."  Eat fresh foods. Avoid eating a lot of canned foods.  Eat more vegetables, fruits, and low-fat dairy products.  Choose whole grains. Look for the word "whole" as the first word in the ingredient list.  Choose fish and skinless chicken or turkey more often than red meat. Limit fish, poultry, and meat to 6 oz (170 g) each day.  Limit sweets, desserts, sugars, and sugary drinks.  Choose heart-healthy fats.  Eat more home-cooked food and less restaurant, buffet, and fast food.  Limit fried foods.  Do not fry foods. Cook foods using methods such as baking, boiling, grilling, and broiling instead.  When eating at a restaurant, ask that your food be prepared with less salt, or no salt if possible. What foods can I eat? Seek help from a dietitian for individual calorie needs. Grains  Whole grain or whole wheat bread. Brown rice. Whole grain or whole wheat pasta. Quinoa, bulgur, and whole grain cereals. Low-sodium cereals. Corn or whole wheat flour tortillas. Whole grain cornbread. Whole grain crackers. Low-sodium crackers. Vegetables  Fresh or frozen vegetables (raw, steamed, roasted, or  grilled). Low-sodium or reduced-sodium tomato and vegetable juices. Low-sodium or reduced-sodium tomato sauce and paste. Low-sodium or reduced-sodium canned vegetables. Fruits  All fresh, canned (in natural juice), or frozen fruits. Meat and Other Protein Products  Ground beef (85% or leaner), grass-fed beef, or beef trimmed of fat. Skinless chicken or turkey. Ground chicken or turkey. Pork trimmed of fat. All fish and seafood. Eggs. Dried beans, peas, or lentils. Unsalted nuts and seeds. Unsalted canned beans. Dairy  Low-fat dairy products, such as skim or 1% milk, 2% or reduced-fat cheeses, low-fat ricotta or cottage cheese, or plain low-fat yogurt. Low-sodium or reduced-sodium cheeses. Fats and Oils  Tub margarines without trans fats. Light or reduced-fat mayonnaise and salad dressings (reduced sodium). Avocado. Safflower, olive, or canola oils. Natural peanut or almond butter. Other  Unsalted popcorn and pretzels. The items listed above may not be a complete list of recommended foods or beverages. Contact your dietitian for more options.  What foods are not recommended? Grains  White bread. White pasta. White rice. Refined cornbread. Bagels and croissants. Crackers that contain trans fat. Vegetables  Creamed or fried vegetables. Vegetables in a cheese sauce. Regular canned vegetables. Regular canned tomato sauce and paste. Regular tomato and vegetable juices. Fruits  Canned fruit in light or heavy syrup. Fruit juice. Meat and Other Protein Products  Fatty cuts of meat. Ribs, chicken wings, bacon, sausage, bologna, salami, chitterlings, fatback, hot dogs, bratwurst, and packaged luncheon meats. Salted nuts and seeds. Canned beans with salt. Dairy  Whole or 2% milk, cream, half-and-half, and cream cheese. Whole-fat or sweetened yogurt. Full-fat cheeses   or blue cheese. Nondairy creamers and whipped toppings. Processed cheese, cheese spreads, or cheese curds. Condiments  Onion and garlic  salt, seasoned salt, table salt, and sea salt. Canned and packaged gravies. Worcestershire sauce. Tartar sauce. Barbecue sauce. Teriyaki sauce. Soy sauce, including reduced sodium. Steak sauce. Fish sauce. Oyster sauce. Cocktail sauce. Horseradish. Ketchup and mustard. Meat flavorings and tenderizers. Bouillon cubes. Hot sauce. Tabasco sauce. Marinades. Taco seasonings. Relishes. Fats and Oils  Butter, stick margarine, lard, shortening, ghee, and bacon fat. Coconut, palm kernel, or palm oils. Regular salad dressings. Other  Pickles and olives. Salted popcorn and pretzels. The items listed above may not be a complete list of foods and beverages to avoid. Contact your dietitian for more information.  Where can I find more information? National Heart, Lung, and Blood Institute: www.nhlbi.nih.gov/health/health-topics/topics/dash/ This information is not intended to replace advice given to you by your health care provider. Make sure you discuss any questions you have with your health care provider. Document Released: 08/08/2011 Document Revised: 01/25/2016 Document Reviewed: 06/23/2013 Elsevier Interactive Patient Education  2017 Elsevier Inc.  

## 2016-09-04 NOTE — Progress Notes (Signed)
BP 117/74   Pulse 81   Temp 99.3 F (37.4 C) (Oral)   Ht _0  (1.575 m)   Wt 193 lb 3.2 oz (87.6 kg)   BMI 35.34 kg/m    Subjective:    Patient ID: Marie Roberts, female    DOB: 1971-07-09, 46 y.o.   MRN: [id number]  HPI: CORALYNN GAONA is a 46 y.o. female presenting on 09/04/2016 for Hypertension and Diabetes  This patient comes in for periodic recheck on medications and conditions. All medications are reviewed today. There are no reports of any problems with the medications. All of the medical conditions are reviewed and updated.  Lab work is reviewed and will be ordered as medically necessary. There are no new problems reported with today's visit.   Past Medical History:  Diagnosis Date  . Cervical cancer ([facility])   . Diabetes mellitus   . High cholesterol   . Hypertension    Relevant past medical, surgical, family and social history reviewed and updated as indicated. Interim medical history since our last visit reviewed. Allergies and medications reviewed and updated. DATA REVIEWED: CHART IN EPIC  Social History   Social History  . Marital status: Married    Spouse name: N/A  . Number of children: N/A  . Years of education: N/A   Occupational History  . Not on file.   Social History Main Topics  . Smoking status: Former Research scientist (life sciences)  . Smokeless tobacco: Never Used  . Alcohol use No  . Drug use: No  . Sexual activity: Not on file   Other Topics Concern  . Not on file   Social History Narrative  . No narrative on file    Past Surgical History:  Procedure Laterality Date  . ABDOMINAL HYSTERECTOMY    . CARPAL TUNNEL RELEASE    . HAND SURGERY      Family History  Problem Relation Age of Onset  . Diabetes Mother   . Diabetes Father     Review of Systems  Constitutional: Negative.  Negative for activity change, fatigue and fever.  HENT: Negative.   Eyes: Negative.   Respiratory: Negative.  Negative for cough.   Cardiovascular: Negative.  Negative for  chest pain.  Gastrointestinal: Negative.  Negative for abdominal pain.  Endocrine: Negative.   Genitourinary: Negative.  Negative for dysuria.  Musculoskeletal: Negative.   Skin: Negative.   Neurological: Negative.     Allergies as of 09/04/2016   No Known Allergies     Medication List       Accurate as of 09/04/16  9:13 AM. Always use your most recent med list.          ALPRAZolam 0.5 MG tablet Commonly known as:  XANAX Take 1 tablet (0.5 mg total) by mouth 2 (two) times daily as needed for anxiety.   citalopram 40 MG tablet Commonly known as:  CELEXA Take 1 tablet (40 mg total) by mouth daily.   fenofibrate 160 MG tablet Take 1 tablet (160 mg total) by mouth daily.   hydrochlorothiazide 25 MG tablet Commonly known as:  HYDRODIURIL Take 1 tablet (25 mg total) by mouth daily.   mometasone 50 MCG/ACT nasal spray Commonly known as:  NASONEX Place 2 sprays into the nose daily.   olmesartan 40 MG tablet Commonly known as:  BENICAR Take 1 tablet (40 mg total) by mouth daily.   phentermine 37.5 MG tablet Commonly known as:  ADIPEX-P Take 1 tablet (37.5 mg total) by mouth daily  before breakfast.   Saxagliptin-Metformin 2.12-998 MG Tb24 Commonly known as:  KOMBIGLYZE XR Take 2.5-1,000 mg by mouth 2 (two) times daily. Take by mouth 2 (two) times daily.          Objective:    BP 117/74   Pulse 81   Temp 99.3 F (37.4 C) (Oral)   Ht _0  (1.575 m)   Wt 193 lb 3.2 oz (87.6 kg)   BMI 35.34 kg/m   No Known Allergies  Wt Readings from Last 3 Encounters:  09/04/16 193 lb 3.2 oz (87.6 kg)  05/22/16 190 lb 6.4 oz (86.4 kg)  10/25/11 207 lb (93.9 kg)    Physical Exam  Constitutional: She is oriented to person, place, and time. She appears well-developed and well-nourished.  HENT:  Head: Normocephalic and atraumatic.  Right Ear: Tympanic membrane, external ear and ear canal normal.  Left Ear: Tympanic membrane, external ear and ear canal normal.  Nose: Nose  normal. No rhinorrhea.  Mouth/Throat: Oropharynx is clear and moist and mucous membranes are normal. No oropharyngeal exudate or posterior oropharyngeal erythema.  Eyes: Conjunctivae and EOM are normal. Pupils are equal, round, and reactive to light.  Neck: Normal range of motion. Neck supple.  Cardiovascular: Normal rate, regular rhythm, normal heart sounds and intact distal pulses.   Pulmonary/Chest: Effort normal and breath sounds normal.  Abdominal: Soft. Bowel sounds are normal.  Neurological: She is alert and oriented to person, place, and time. She has normal reflexes.  Skin: Skin is warm and dry. No rash noted.  Psychiatric: She has a normal mood and affect. Her behavior is normal. Judgment and thought content normal.        Assessment & Plan:   1. Essential hypertension - CMP14+EGFR - CBC with Differential/Platelet - Lipid panel - hydrochlorothiazide (HYDRODIURIL) 25 MG tablet; Take 1 tablet (25 mg total) by mouth daily.  Dispense: 90 tablet; Refill: 3 - olmesartan (BENICAR) 40 MG tablet; Take 1 tablet (40 mg total) by mouth daily.  Dispense: 30 tablet; Refill: 11  2. Type 1 diabetes mellitus without complication (HCC) - Bayer DCA Hb A1c Waived - CMP14+EGFR - CBC with Differential/Platelet - Saxagliptin-Metformin (KOMBIGLYZE XR) 2.12-998 MG TB24; Take 2.5-1,000 mg by mouth 2 (two) times daily. Take by mouth 2 (two) times daily.  Dispense: 30 tablet; Refill: 6  3. Generalized anxiety disorder - ALPRAZolam (XANAX) 0.5 MG tablet; Take 1 tablet (0.5 mg total) by mouth 2 (two) times daily as needed for anxiety.  Dispense: 60 tablet; Refill: 2 - citalopram (CELEXA) 40 MG tablet; Take 1 tablet (40 mg total) by mouth daily.  Dispense: 30 tablet; Refill: 11  4. Hypertriglyceridemia - fenofibrate 160 MG tablet; Take 1 tablet (160 mg total) by mouth daily.  Dispense: 30 tablet; Refill: 11  5. Chronic nonseasonal allergic rhinitis due to pollen DASH diet - mometasone (NASONEX) 50  MCG/ACT nasal spray; Place 2 sprays into the nose daily.  Dispense: 17 g; Refill: 11   Continue all other maintenance medications as listed above.  Follow up plan: Return in about 3 months (around 12/03/2016) for recheck.  Orders Placed This Encounter  Procedures  . Bayer DCA Hb A1c Waived  . CMP14+EGFR  . CBC with Differential/Platelet  . Lipid panel    Educational handout given for [place] PA-C [facility] [address]  [place], [place] [address] [phone]   09/04/2016, 9:13 AM

## 2016-09-05 LAB — CMP14+EGFR
ALBUMIN: 4.6 g/dL (ref 3.5–5.5)
ALT: 25 IU/L (ref 0–32)
AST: 19 IU/L (ref 0–40)
Albumin/Globulin Ratio: 1.8 (ref 1.2–2.2)
Alkaline Phosphatase: 54 IU/L (ref 39–117)
BILIRUBIN TOTAL: 0.3 mg/dL (ref 0.0–1.2)
BUN/Creatinine Ratio: 17 (ref 9–23)
BUN: 13 mg/dL (ref 6–24)
CO2: 20 mmol/L (ref 18–29)
CREATININE: 0.78 mg/dL (ref 0.57–1.00)
Calcium: 10.2 mg/dL (ref 8.7–10.2)
Chloride: 95 mmol/L — ABNORMAL LOW (ref 96–106)
GFR calc non Af Amer: 91 mL/min/{1.73_m2} (ref >59)
GFR, EST AFRICAN AMERICAN: 105 mL/min/{1.73_m2} (ref >59)
GLUCOSE: 232 mg/dL — AB (ref 65–99)
Globulin, Total: 2.6 g/dL (ref 1.5–4.5)
Potassium: 4.2 mmol/L (ref 3.5–5.2)
Sodium: 136 mmol/L (ref 134–144)
TOTAL PROTEIN: 7.2 g/dL (ref 6.0–8.5)

## 2016-09-05 LAB — CBC WITH DIFFERENTIAL/PLATELET
Basophils Absolute: 0.1 10*3/uL (ref 0.0–0.2)
Basos: 1 %
EOS (ABSOLUTE): 0.2 10*3/uL (ref 0.0–0.4)
EOS: 2 %
HEMATOCRIT: 37 % (ref 34.0–46.6)
HEMOGLOBIN: 11.8 g/dL (ref 11.1–15.9)
Immature Grans (Abs): 0 10*3/uL (ref 0.0–0.1)
Immature Granulocytes: 0 %
Lymphocytes Absolute: 2.2 10*3/uL (ref 0.7–3.1)
Lymphs: 21 %
MCH: 28.5 pg (ref 26.6–33.0)
MCHC: 31.9 g/dL (ref 31.5–35.7)
MCV: 89 fL (ref 79–97)
MONOCYTES: 5 %
Monocytes Absolute: 0.5 10*3/uL (ref 0.1–0.9)
NEUTROS ABS: 7.5 10*3/uL — AB (ref 1.4–7.0)
Neutrophils: 71 %
Platelets: 435 10*3/uL — ABNORMAL HIGH (ref 150–379)
RBC: 4.14 x10E6/uL (ref 3.77–5.28)
RDW: 14 % (ref 12.3–15.4)
WBC: 10.5 10*3/uL (ref 3.4–10.8)

## 2016-09-05 LAB — LIPID PANEL
CHOL/HDL RATIO: 5.6 ratio — AB (ref 0.0–4.4)
Cholesterol, Total: 239 mg/dL — ABNORMAL HIGH (ref 100–199)
HDL: 43 mg/dL (ref >39)
Triglycerides: 461 mg/dL — ABNORMAL HIGH (ref 0–149)

## 2016-09-10 ENCOUNTER — Encounter: Payer: Self-pay | Admitting: Pharmacist

## 2016-09-10 ENCOUNTER — Ambulatory Visit (INDEPENDENT_AMBULATORY_CARE_PROVIDER_SITE_OTHER): Payer: 59 | Admitting: Pharmacist

## 2016-09-10 VITALS — BP 122/72 | HR 82 | Ht 62.0 in | Wt 192.5 lb

## 2016-09-10 DIAGNOSIS — IMO0001 Reserved for inherently not codable concepts without codable children: Secondary | ICD-10-CM

## 2016-09-10 DIAGNOSIS — Z6835 Body mass index (BMI) 35.0-35.9, adult: Secondary | ICD-10-CM

## 2016-09-10 DIAGNOSIS — E669 Obesity, unspecified: Secondary | ICD-10-CM | POA: Insufficient documentation

## 2016-09-10 DIAGNOSIS — E119 Type 2 diabetes mellitus without complications: Secondary | ICD-10-CM | POA: Diagnosis not present

## 2016-09-10 DIAGNOSIS — E6609 Other obesity due to excess calories: Secondary | ICD-10-CM | POA: Diagnosis not present

## 2016-09-10 MED ORDER — DULAGLUTIDE 0.75 MG/0.5ML ~~LOC~~ SOAJ: 0.7500 mg | SUBCUTANEOUS | 1 refills | Status: DC

## 2016-09-10 MED ORDER — METFORMIN HCL 1000 MG PO TABS: 1000.0000 mg | ORAL_TABLET | Freq: Two times a day (BID) | ORAL | 1 refills | Status: DC

## 2016-09-10 NOTE — Progress Notes (Signed)
Patient ID: Marie Roberts, female   DOB: Aug 20, 1971, 46 y.o.   MRN: [id number]   Subjective:    Marie Roberts is a 46 y.o. female who presents for an initial evaluation of Type 2 diabetes mellitus and obesity.     Marie Roberts last A1c was 8.7%.  She is currently taking Kombiglyze 2.5/1000mg  1 tablet bid for diabetes and has been taking phentermine 37.5mg  1 tablet daily for about 12 months.  She admits that she does not take phentermine every day because she has a desk job and sometimes the phentermine makes her feel jittery and it ismore difficult to concentrate.  She has lost about 15 lbs over the last year.   She has also tried Invokana in past for DM but stopped due to increased frequency of yeast infections.   Today she also asks about continuing citalopram 40mg  qd.  She feels that maybe it is making her feel "flat".  She has been taking for several years and wonders if she still needs it as she feels that she is mentally doing well.   SH: Marie Roberts is married and has 2 grown sons - one son still lives with her.  She has a full time job with [employer].    Known diabetic complications: none Cardiovascular risk factors: diabetes mellitus, dyslipidemia, hypertension, obesity (BMI >= 30 kg/m2) and sedentary lifestyle Eye exam current (within one year): yes - my ey dr in [place] - will request records Weight trend: fluctuated over the last 6 months Prior visit with CDE: no Current diet: in general, a "healthy" diet   but there are times of indiscretion Eats lots of venison.  Limits potatoes, pasta and rice.  She loves all green vegetables and has been eating more recently. Current exercise: none Medication Compliance?  Yes  Current monitoring regimen: home blood tests - one times daily Home blood sugar records: did not bring in records today but reports BG in 150 to 225 range.  Any episodes of hypoglycemia? no  Is She on ACE inhibitor or angiotensin II receptor blocker?   Yes    olmesartan (Benicar)  The following portions of the patient's history were reviewed and updated as appropriate: allergies, current medications, past family history, past medical history, past social history, past surgical history and problem list.    Objective:    BP 122/72   Pulse 82   Ht 5\' 2"  (1.575 m)   Wt 192 lb 8 oz (87.3 kg)   BMI 35.21 kg/m   Lab Review Glucose (mg/dL)  Date Value  09/04/2016 232 (H)  05/22/2016 224 (H)   Glucose, Bld (mg/dL)  Date Value  01/07/2011 111 (H)  03/18/2010 133 (H)  07/21/2009 158 (H)   CO2  Date Value  09/04/2016 20 mmol/L  05/22/2016 25 mmol/L  01/07/2011 29 mEq/L   BUN (mg/dL)  Date Value  09/04/2016 13  05/22/2016 18  01/07/2011 9  03/18/2010 14  07/21/2009 10   Creatinine, Ser (mg/dL)  Date Value  09/04/2016 0.78  05/22/2016 0.87  01/07/2011 0.54   A1c = 8.7% (09/04/2016)    Assessment:    Diabetes Mellitus type II, under inadequate control.   HTN - BP is at goal Obesity - weight has decreased 15# over the last year but since first visit at our office weight has fluctuated from 190# to 193#   Plan:    1.  Rx changes:   D/C Kombiglyze  Start metformin 1000mg  BID  Start  Trulicity 0.75mg  SQ Q week - This might help with both weight and BG without causing the "jittery" feeling from phentermine.  I recommended that if she still felt that she needed something for appetite suppression she can take 1/2 tablet of phentermine each morning.   Trial of 1/2 tablet of citalopram 40mg  - if mood changes or increase in depression then she should restart 1 tablet daily and RTC to see PCP.  2.  Education: Reviewed 'ABCs' of diabetes management (respective goals in parentheses):  A1C (<7), blood pressure (<130/80), and cholesterol (LDL <100). 3.  Urine albumin checked today - pending 4. CHO counting diet discussed.  Reviewed CHO amount in various foods and how to read nutrition labels.  Discussed recommended serving  sizes.  5.  Recommend check BG 1-2 times a day 6.  Recommended increase physical activity - advised patient to set timer for once each hour while at work.  Encouraged her to get up and walk around and move for at lest 5 minutes each hour she is at work. 7. Follow up: 2 months - sooner if she has questions, if BG is still over 150 in am or over 200 on a regular basis.

## 2016-09-10 NOTE — Patient Instructions (Signed)
Try decreasing phentermine and citalopram to 1/2 tablet daily.   Start Trulicity once weekly and metformin '1000mg'$  take 1 tablet twice a day  Stop Kombiglyze  Diabetes and Standards of Medical Care   Diabetes is complicated. You may find that your diabetes team includes a dietitian, nurse, diabetes educator, eye doctor, and more. To help everyone know what is going on and to help you get the care you deserve, the following schedule of care was developed to help keep you on track. Below are the tests, exams, vaccines, medicines, education, and plans you will need.  Blood Glucose Goals Prior to meals = 80 - 130 Within 2 hours of the start of a meal = less than 180  HbA1c test (goal is less than 7.0% - your last value was 8.7%) This test shows how well you have controlled your glucose over the past 2 to 3 months. It is used to see if your diabetes management plan needs to be adjusted.   It is performed at least 2 times a year if you are meeting treatment goals.  It is performed 4 times a year if therapy has changed or if you are not meeting treatment goals.  Blood pressure test  This test is performed at every routine medical visit. The goal is less than 140/90 mmHg for most people, but 130/80 mmHg in some cases. Ask your health care provider about your goal.  Dental exam  Follow up with the dentist regularly.  Eye exam  If you are diagnosed with type 1 diabetes as a child, get an exam upon reaching the age of 57 years or older and have had diabetes for 3 to 5 years. Yearly eye exams are recommended after that initial eye exam.  If you are diagnosed with type 1 diabetes as an adult, get an exam within 5 years of diagnosis and then yearly.  If you are diagnosed with type 2 diabetes, get an exam as soon as possible after the diagnosis and then yearly.  Foot care exam  Visual foot exams are performed at every routine medical visit. The exams check for cuts, injuries, or other problems  with the feet.  A comprehensive foot exam should be done yearly. This includes visual inspection as well as assessing foot pulses and testing for loss of sensation.  Check your feet nightly for cuts, injuries, or other problems with your feet. Tell your health care provider if anything is not healing.  Kidney function test (urine microalbumin)  This test is performed once a year.  Type 1 diabetes: The first test is performed 5 years after diagnosis.  Type 2 diabetes: The first test is performed at the time of diagnosis.  A serum creatinine and estimated glomerular filtration rate (eGFR) test is done once a year to assess the level of chronic kidney disease (CKD), if present.  Lipid profile (cholesterol, HDL, LDL, triglycerides)  Performed every 5 years for most people.  The goal for LDL is less than 100 mg/dL. If you are at high risk, the goal is less than 70 mg/dL.  The goal for HDL is 40 mg/dL to 50 mg/dL for men and 50 mg/dL to 60 mg/dL for women. An HDL cholesterol of 60 mg/dL or higher gives some protection against heart disease.  The goal for triglycerides is less than 150 mg/dL.  Influenza vaccine, pneumococcal vaccine, and hepatitis B vaccine  The influenza vaccine is recommended yearly.  The pneumococcal vaccine is generally given once in a lifetime. However,  there are some instances when another vaccination is recommended. Check with your health care provider.  The hepatitis B vaccine is also recommended for adults with diabetes.  Diabetes self-management education  Education is recommended at diagnosis and ongoing as needed.  Treatment plan  Your treatment plan is reviewed at every medical visit.  Document Released: 06/16/2009 Document Revised: 04/21/2013 Document Reviewed: 01/19/2013 [facility] Patient Information 2014 [place].

## 2016-09-11 ENCOUNTER — Telehealth: Payer: Self-pay | Admitting: Physician Assistant

## 2016-09-12 NOTE — Telephone Encounter (Signed)
I received new card information from web site for patient.  Called Suanne Marker at [facility] and information provided to her to run through copay card.  Trulicity copay card infor:  Rx Bin: [id number]; PCN: [id number]; Group: [id number]; IDOK:[id number] Patient notified - left message on VM

## 2016-10-03 ENCOUNTER — Other Ambulatory Visit: Payer: Self-pay | Admitting: Physician Assistant

## 2016-10-03 DIAGNOSIS — F411 Generalized anxiety disorder: Secondary | ICD-10-CM

## 2016-10-04 NOTE — Telephone Encounter (Signed)
refilled on 09/04/16- no refills  Please address further refills and have nurse phone this in

## 2016-11-06 ENCOUNTER — Encounter: Payer: Self-pay | Admitting: Pharmacist

## 2016-11-06 ENCOUNTER — Ambulatory Visit (INDEPENDENT_AMBULATORY_CARE_PROVIDER_SITE_OTHER): Payer: 59 | Admitting: Pharmacist

## 2016-11-06 VITALS — BP 110/70 | HR 78 | Ht 62.0 in | Wt 188.2 lb

## 2016-11-06 DIAGNOSIS — E6609 Other obesity due to excess calories: Secondary | ICD-10-CM | POA: Diagnosis not present

## 2016-11-06 DIAGNOSIS — E119 Type 2 diabetes mellitus without complications: Secondary | ICD-10-CM

## 2016-11-06 DIAGNOSIS — Z6834 Body mass index (BMI) 34.0-34.9, adult: Secondary | ICD-10-CM | POA: Diagnosis not present

## 2016-11-06 MED ORDER — ESCITALOPRAM OXALATE 20 MG PO TABS: 20.0000 mg | ORAL_TABLET | Freq: Every day | ORAL | 1 refills | Status: DC

## 2016-11-06 MED ORDER — DULAGLUTIDE 1.5 MG/0.5ML ~~LOC~~ SOAJ: 1.5000 mg | SUBCUTANEOUS | 1 refills | Status: DC

## 2016-11-06 NOTE — Progress Notes (Signed)
Patient ID: JERNEY BAKSH, female   DOB: 02-Jan-1971, 46 y.o.   MRN: [id number]    Subjective:    Marie Roberts is a 46 y.o. female who presents for re-evaluation of Type 2 diabetes mellitus and obesity.     Marie Roberts last A1c was 8.7%.  At out last visit about 1 month ago we stopped Kombiglyze 2.5/1000mg  and switched to Trulicity 0.75mg  weekly and metformin 1000mg  bid.  She has tolerated Trulicity well.  She states that she often feel hungry but does not really have an appetite.  Hunger might be related to visual or environmental cues.  She has also continued phentermine 37.5mg  1 tablet daily.    She tried Invokana in past for DM but stopped due to increased frequency of yeast infections.   Current monitoring regimen: home blood tests - one time daily Home blood sugar records: did not bring in records today but reports BG in 130 to 200 range.  She denies any hypoglycemic episodes  Diet: eating smaller meals and more at home.  She is having smoothie in afternoon (fruit, low fat greek yogurt and almond or 1% milk).  Limiting pasta and bread  Known diabetic complications: none Cardiovascular risk factors: diabetes mellitus, dyslipidemia, hypertension, obesity (BMI >= 30 kg/m2) and sedentary lifestyle Eye exam current (within one year): yes - 02/2016 [facility] [place] - will request records Weight trend: decreasing steadily Prior visit with CDE: yes. Current exercise: none Medication Compliance?  Yes  Is She on ACE inhibitor or angiotensin II receptor blocker?  Yes  - olmesartan (Benicar)  At our last visit we also decrease citalopram from 40mg  to 20mg  because patient reports she felt "flat".  She tried 20mg  dose for 2-3 weeks but the noticed that she was picking and biting around her nail beds and felt that she was having more anxiety.  She restart citalopram 40mg  about 1 week ago and picking has not improved.    SH: Marie Roberts is married and has 2 grown sons - one son still  lives with her.  She has a full time job with [employer].    The following portions of the patient's history were reviewed and updated as appropriate: allergies, current medications, past family history, past medical history, past social history, past surgical history and problem list.    Objective:    BP 110/70   Pulse 78   Ht 5\' 2"  (1.575 m)   Wt 188 lb 4 oz (85.4 kg)   BMI 34.43 kg/m   Lab Review Glucose (mg/dL)  Date Value  09/04/2016 232 (H)  05/22/2016 224 (H)   Glucose, Bld (mg/dL)  Date Value  01/07/2011 111 (H)  03/18/2010 133 (H)  07/21/2009 158 (H)   CO2  Date Value  09/04/2016 20 mmol/L  05/22/2016 25 mmol/L  01/07/2011 29 mEq/L   BUN (mg/dL)  Date Value  09/04/2016 13  05/22/2016 18  01/07/2011 9  03/18/2010 14  07/21/2009 10   Creatinine, Ser (mg/dL)  Date Value  09/04/2016 0.78  05/22/2016 0.87  01/07/2011 0.54   A1c = 8.7% (09/04/2016)    Assessment:    Diabetes Mellitus type II, under inadequate control but improving.   HTN - BP is at goal Obesity, class 1 - weight has decreased 5# over the last 6 weeks and 15# over the last year Anxiety - worsened with decrease in citalopram dose   Plan:    1.  Rx changes:  Continue metformin 1000mg  BID  Increase Trulicity 1.5mg  SQ Q week -   Switch citalopram to Lexapro 20mg  1 tablet daily  2.  Reviewed health maintance - will have our quality department request copies of Pap from [facility] and eye exam from [facility]  3.  Urine albumin checked today - pending 4. CHO counting diet discussed.  Reviewed CHO amount in various foods and how to read nutrition labels.  Discussed recommended serving sizes.  5.  Recommend check BG 1-2 times a day 6.  Recommended increase physical activity  7. Follow up: 6 weeks with PCP and with me as needed for BG.

## 2016-11-07 ENCOUNTER — Encounter: Payer: Self-pay | Admitting: Pharmacist

## 2016-11-07 LAB — MICROALBUMIN / CREATININE URINE RATIO
Creatinine, Urine: 128.9 mg/dL
Microalb/Creat Ratio: 4.5 mg/g creat (ref 0.0–30.0)
Microalbumin, Urine: 5.8 ug/mL

## 2016-12-02 ENCOUNTER — Other Ambulatory Visit: Payer: Self-pay | Admitting: Pharmacist

## 2016-12-10 ENCOUNTER — Other Ambulatory Visit: Payer: Self-pay | Admitting: Physician Assistant

## 2016-12-10 MED ORDER — LORATADINE 10 MG PO TABS: 10.0000 mg | ORAL_TABLET | Freq: Every day | ORAL | 11 refills | Status: DC

## 2016-12-16 ENCOUNTER — Ambulatory Visit: Payer: Self-pay | Admitting: Physician Assistant

## 2016-12-16 ENCOUNTER — Ambulatory Visit: Payer: 59 | Admitting: Physician Assistant

## 2017-01-10 ENCOUNTER — Other Ambulatory Visit: Payer: Self-pay | Admitting: Physician Assistant

## 2017-02-15 ENCOUNTER — Other Ambulatory Visit: Payer: Self-pay | Admitting: Physician Assistant

## 2017-02-15 ENCOUNTER — Other Ambulatory Visit: Payer: Self-pay | Admitting: Family Medicine

## 2017-02-15 DIAGNOSIS — F411 Generalized anxiety disorder: Secondary | ICD-10-CM

## 2017-02-17 NOTE — Telephone Encounter (Signed)
Refill called to [facility] 

## 2017-02-17 NOTE — Telephone Encounter (Signed)
Last filled 01/13/17, last seen 09/04/16. Route to pool for call in

## 2017-02-26 ENCOUNTER — Telehealth: Payer: Self-pay

## 2017-02-26 MED ORDER — SULFAMETHOXAZOLE-TRIMETHOPRIM 800-160 MG PO TABS: 1.0000 | ORAL_TABLET | Freq: Two times a day (BID) | ORAL | 0 refills | Status: DC

## 2017-02-26 NOTE — Telephone Encounter (Signed)
Patient informed. 

## 2017-02-26 NOTE — Telephone Encounter (Signed)
Patient feels that she has a UTI.  Is experiencing urinary pressure, frequency and burning.  Has been going on for about 3-4 days and is worsening.  Would like to know if you will send in an antibiotic to [facility].  Please advise.

## 2017-02-26 NOTE — Addendum Note (Signed)
Addended by: Terald Sleeper on: 02/26/2017 04:17 PM   Modules accepted: Orders

## 2017-03-21 ENCOUNTER — Other Ambulatory Visit: Payer: Self-pay | Admitting: Pharmacist

## 2017-03-21 ENCOUNTER — Other Ambulatory Visit: Payer: Self-pay | Admitting: Physician Assistant

## 2017-03-24 ENCOUNTER — Ambulatory Visit: Payer: 59 | Admitting: Physician Assistant

## 2017-03-31 ENCOUNTER — Ambulatory Visit (INDEPENDENT_AMBULATORY_CARE_PROVIDER_SITE_OTHER): Payer: 59 | Admitting: Physician Assistant

## 2017-03-31 ENCOUNTER — Encounter: Payer: Self-pay | Admitting: Physician Assistant

## 2017-03-31 VITALS — BP 95/69 | HR 77 | Temp 98.3°F | Ht 62.0 in | Wt 185.4 lb

## 2017-03-31 DIAGNOSIS — E109 Type 1 diabetes mellitus without complications: Secondary | ICD-10-CM | POA: Diagnosis not present

## 2017-03-31 DIAGNOSIS — F411 Generalized anxiety disorder: Secondary | ICD-10-CM | POA: Diagnosis not present

## 2017-03-31 DIAGNOSIS — I10 Essential (primary) hypertension: Secondary | ICD-10-CM | POA: Diagnosis not present

## 2017-03-31 DIAGNOSIS — E781 Pure hyperglyceridemia: Secondary | ICD-10-CM | POA: Diagnosis not present

## 2017-03-31 DIAGNOSIS — J40 Bronchitis, not specified as acute or chronic: Secondary | ICD-10-CM

## 2017-03-31 MED ORDER — AZITHROMYCIN 250 MG PO TABS: ORAL_TABLET | ORAL | 0 refills | Status: DC

## 2017-03-31 MED ORDER — OLMESARTAN MEDOXOMIL 40 MG PO TABS: 20.0000 mg | ORAL_TABLET | Freq: Every day | ORAL | 11 refills | Status: DC

## 2017-03-31 NOTE — Progress Notes (Signed)
BP 95/69   Pulse 77   Temp 98.3 F (36.8 C) (Oral)   Ht '5\' 2"'$  (1.575 m)   Wt 185 lb 6.4 oz (84.1 kg)   BMI 33.91 kg/m    Subjective:    Patient ID: Marie Roberts, female    DOB: 08-Mar-1971, 46 y.o.   MRN: [id number]  HPI: Marie Roberts is a 46 y.o. female presenting on 03/31/2017 for Follow-up; Hypertension; Sore Throat; and Cough  This patient comes in for periodic recheck on medications and conditions including hypertension, type 1 diabetes, elevated TG, GAD. Also with cough and cold symptoms since last week. Cough is tight and productive at times.   All medications are reviewed today. There are no reports of any problems with the medications. All of the medical conditions are reviewed and updated.  Lab work is reviewed and will be ordered as medically necessary. There are no new problems reported with today's visit.   Relevant past medical, surgical, family and social history reviewed and updated as indicated. Allergies and medications reviewed and updated.  Past Medical History:  Diagnosis Date  . Cervical cancer ([place])   . Diabetes mellitus   . High cholesterol   . Hypertension     Past Surgical History:  Procedure Laterality Date  . ABDOMINAL HYSTERECTOMY    . CARPAL TUNNEL RELEASE    . HAND SURGERY      Review of Systems  Constitutional: Negative.  Negative for activity change, fatigue and fever.  HENT: Negative.   Eyes: Negative.   Respiratory: Positive for cough. Negative for wheezing.   Cardiovascular: Negative.  Negative for chest pain, palpitations and leg swelling.  Gastrointestinal: Negative.  Negative for abdominal pain.  Endocrine: Negative.   Genitourinary: Negative.  Negative for dysuria.  Musculoskeletal: Negative.   Skin: Negative.   Neurological: Negative.     Allergies as of 03/31/2017   No Known Allergies     Medication List       Accurate as of 03/31/17  4:47 PM. Always use your most recent med list.          ALPRAZolam 0.5 MG  tablet Commonly known as:  XANAX TAKE 1 TABLET TWICE DAILY AS NEEDED FOR ANXIETY   azithromycin 250 MG tablet Commonly known as:  ZITHROMAX Z-PAK Take as directed   Dulaglutide 1.5 MG/0.5ML Sopn Commonly known as:  TRULICITY Inject 1.5 mg into the skin once a week.   escitalopram 20 MG tablet Commonly known as:  LEXAPRO TAKE 1 TABLET DAILY   fenofibrate 160 MG tablet Take 1 tablet (160 mg total) by mouth daily.   hydrochlorothiazide 25 MG tablet Commonly known as:  HYDRODIURIL Take 1 tablet (25 mg total) by mouth daily.   loratadine 10 MG tablet Commonly known as:  CLARITIN Take 1 tablet (10 mg total) by mouth daily.   metFORMIN 1000 MG tablet Commonly known as:  GLUCOPHAGE TAKE (1) TABLET TWICE A DAY WITH MEALS (BREAKFAST AND SUPPER)   olmesartan 40 MG tablet Commonly known as:  BENICAR Take 0.5-1 tablets (20-40 mg total) by mouth daily.   phentermine 37.5 MG tablet Commonly known as:  ADIPEX-P Take 1 tablet (37.5 mg total) by mouth daily before breakfast.          Objective:    BP 95/69   Pulse 77   Temp 98.3 F (36.8 C) (Oral)   Ht '5\' 2"'$  (1.575 m)   Wt 185 lb 6.4 oz (84.1 kg)   BMI 33.91 kg/m  No Known Allergies  Physical Exam  Constitutional: She is oriented to person, place, and time. She appears well-developed and well-nourished.  HENT:  Head: Normocephalic and atraumatic.  Right Ear: A middle ear effusion is present.  Left Ear: A middle ear effusion is present.  Nose: Mucosal edema present. Right sinus exhibits no frontal sinus tenderness. Left sinus exhibits no frontal sinus tenderness.  Mouth/Throat: Posterior oropharyngeal erythema present. No oropharyngeal exudate or tonsillar abscesses.  Eyes: Pupils are equal, round, and reactive to light. Conjunctivae and EOM are normal.  Neck: Normal range of motion.  Cardiovascular: Normal rate, regular rhythm, normal heart sounds and intact distal pulses.   Pulmonary/Chest: Effort normal. She has  wheezes.  Abdominal: Soft. Bowel sounds are normal.  Neurological: She is alert and oriented to person, place, and time. She has normal reflexes.  Skin: Skin is warm and dry. No rash noted.  Psychiatric: She has a normal mood and affect. Her behavior is normal. Judgment and thought content normal.  Nursing note and vitals reviewed.   Results for orders placed or performed in visit on 12/04/16  HM DIABETES EYE EXAM  Result Value Ref Range   HM Diabetic Eye Exam No Retinopathy No Retinopathy      Assessment & Plan:   1. Essential hypertension - CMP14+EGFR; Future - Lipid panel; Future - Bayer DCA Hb A1c Waived; Future - olmesartan (BENICAR) 40 MG tablet; Take 0.5-1 tablets (20-40 mg total) by mouth daily.  Dispense: 30 tablet; Refill: 11  2. Type 1 diabetes mellitus without complication (HCC) - EGB15+VVOH; Future - Lipid panel; Future - Bayer DCA Hb A1c Waived; Future  3. Hypertriglyceridemia - Lipid panel; Future  4. Generalized anxiety disorder  5. Bronchitis   Current Outpatient Prescriptions:  .  ALPRAZolam (XANAX) 0.5 MG tablet, TAKE 1 TABLET TWICE DAILY AS NEEDED FOR ANXIETY, Disp: 60 tablet, Rfl: 2 .  Dulaglutide (TRULICITY) 1.5 YW/7.3XT SOPN, Inject 1.5 mg into the skin once a week., Disp: 4 pen, Rfl: 1 .  escitalopram (LEXAPRO) 20 MG tablet, TAKE 1 TABLET DAILY, Disp: 30 tablet, Rfl: 0 .  fenofibrate 160 MG tablet, Take 1 tablet (160 mg total) by mouth daily., Disp: 30 tablet, Rfl: 11 .  hydrochlorothiazide (HYDRODIURIL) 25 MG tablet, Take 1 tablet (25 mg total) by mouth daily., Disp: 90 tablet, Rfl: 3 .  loratadine (CLARITIN) 10 MG tablet, Take 1 tablet (10 mg total) by mouth daily., Disp: 30 tablet, Rfl: 11 .  metFORMIN (GLUCOPHAGE) 1000 MG tablet, TAKE (1) TABLET TWICE A DAY WITH MEALS (BREAKFAST AND SUPPER), Disp: 60 tablet, Rfl: 2 .  olmesartan (BENICAR) 40 MG tablet, Take 0.5-1 tablets (20-40 mg total) by mouth daily., Disp: 30 tablet, Rfl: 11 .  phentermine  (ADIPEX-P) 37.5 MG tablet, Take 1 tablet (37.5 mg total) by mouth daily before breakfast., Disp: 30 tablet, Rfl: 2 .  azithromycin (ZITHROMAX Z-PAK) 250 MG tablet, Take as directed, Disp: 6 each, Rfl: 0  Continue all other maintenance medications as listed above.  Follow up plan: Return in about 3 months (around 07/01/2017) for recheck.  Educational handout given for [place] PA-C [facility] [address]  [place], [place] [address] [phone]   03/31/2017, 4:47 PM

## 2017-03-31 NOTE — Patient Instructions (Signed)
In a few days you may receive a survey in the mail or online from [employer] regarding your visit with us today. Please take a moment to fill this out. Your feedback is very important to our whole office. It can help us better understand your needs as well as improve your experience and satisfaction. Thank you for taking your time to complete it. We care about you.  Shawon Denzer, PA-C  

## 2017-04-03 ENCOUNTER — Encounter: Payer: Self-pay | Admitting: Family Medicine

## 2017-04-03 ENCOUNTER — Ambulatory Visit (INDEPENDENT_AMBULATORY_CARE_PROVIDER_SITE_OTHER): Payer: 59 | Admitting: Family Medicine

## 2017-04-03 VITALS — BP 115/70 | HR 87 | Temp 98.4°F | Ht 62.0 in | Wt 185.0 lb

## 2017-04-03 DIAGNOSIS — J40 Bronchitis, not specified as acute or chronic: Secondary | ICD-10-CM | POA: Diagnosis not present

## 2017-04-03 MED ORDER — HYDROCODONE-HOMATROPINE 5-1.5 MG/5ML PO SYRP: 5.0000 mL | ORAL_SOLUTION | Freq: Three times a day (TID) | ORAL | 0 refills | Status: DC | PRN

## 2017-04-03 NOTE — Progress Notes (Signed)
Patient ID: Marie Roberts, female   DOB: 1971/02/13, 46 y.o.   MRN: [id number]   S: KG:YJEHU sore throat Patient was seen earlier this week and given Zithromax with diagnosis of bronchitis. Cough is worse at night and is mostly nonproductive. She has been taking OTC Tussend.     O: BP 115/70   Pulse 87   Temp 98.4 F (36.9 C) (Oral)   Ht 5\' 2"  (1.575 m)   Wt 185 lb (83.9 kg)   BMI 33.84 kg/m   Patient in no acute distress Ears-TMs are clear with normal light reflex Throat-clear (patient questions whether or not she might have strep. I explained that Zithromax would cover strep and would not be useful to do a strep test given that information) Chest-lungs are clear to auscultation  A: Bronchitis, by history   P: Continue Tussend in daytime. Hycodan one or 2 teaspoons at bedtime when necessary

## 2017-04-29 ENCOUNTER — Other Ambulatory Visit: Payer: Self-pay | Admitting: Physician Assistant

## 2017-05-06 ENCOUNTER — Other Ambulatory Visit: Payer: Self-pay | Admitting: *Deleted

## 2017-05-06 MED ORDER — DULAGLUTIDE 1.5 MG/0.5ML ~~LOC~~ SOAJ: 1.5000 mg | SUBCUTANEOUS | 1 refills | Status: DC

## 2017-05-09 ENCOUNTER — Other Ambulatory Visit: Payer: Self-pay | Admitting: Physician Assistant

## 2017-05-09 MED ORDER — FLUCONAZOLE 150 MG PO TABS: 150.0000 mg | ORAL_TABLET | Freq: Once | ORAL | 0 refills | Status: DC

## 2017-05-09 NOTE — Telephone Encounter (Signed)
Left message requested rx sent to pharmacy.

## 2017-05-09 NOTE — Telephone Encounter (Signed)
Please review and advise.

## 2017-05-20 ENCOUNTER — Other Ambulatory Visit: Payer: 59

## 2017-05-20 DIAGNOSIS — E109 Type 1 diabetes mellitus without complications: Secondary | ICD-10-CM

## 2017-05-20 DIAGNOSIS — I10 Essential (primary) hypertension: Secondary | ICD-10-CM

## 2017-05-20 LAB — BAYER DCA HB A1C WAIVED: HB A1C: 8.1 % — AB (ref <7.0)

## 2017-05-21 LAB — CMP14+EGFR
ALBUMIN: 4.4 g/dL (ref 3.5–5.5)
ALT: 9 IU/L (ref 0–32)
AST: 13 IU/L (ref 0–40)
Albumin/Globulin Ratio: 1.8 (ref 1.2–2.2)
Alkaline Phosphatase: 47 IU/L (ref 39–117)
BUN / CREAT RATIO: 18 (ref 9–23)
BUN: 14 mg/dL (ref 6–24)
Bilirubin Total: 0.3 mg/dL (ref 0.0–1.2)
CALCIUM: 9.6 mg/dL (ref 8.7–10.2)
CO2: 19 mmol/L — ABNORMAL LOW (ref 20–29)
CREATININE: 0.8 mg/dL (ref 0.57–1.00)
Chloride: 102 mmol/L (ref 96–106)
GFR, EST AFRICAN AMERICAN: 102 mL/min/{1.73_m2} (ref >59)
GFR, EST NON AFRICAN AMERICAN: 88 mL/min/{1.73_m2} (ref >59)
GLUCOSE: 197 mg/dL — AB (ref 65–99)
Globulin, Total: 2.4 g/dL (ref 1.5–4.5)
Potassium: 4.5 mmol/L (ref 3.5–5.2)
Sodium: 136 mmol/L (ref 134–144)
TOTAL PROTEIN: 6.8 g/dL (ref 6.0–8.5)

## 2017-05-21 LAB — LIPID PANEL
CHOL/HDL RATIO: 4.5 ratio — AB (ref 0.0–4.4)
Cholesterol, Total: 198 mg/dL (ref 100–199)
HDL: 44 mg/dL (ref >39)
LDL CALC: 110 mg/dL — AB (ref 0–99)
TRIGLYCERIDES: 220 mg/dL — AB (ref 0–149)
VLDL Cholesterol Cal: 44 mg/dL — ABNORMAL HIGH (ref 5–40)

## 2017-06-13 ENCOUNTER — Other Ambulatory Visit: Payer: Self-pay | Admitting: *Deleted

## 2017-06-13 MED ORDER — DULAGLUTIDE 1.5 MG/0.5ML ~~LOC~~ SOAJ: 1.5000 mg | SUBCUTANEOUS | 1 refills | Status: DC

## 2017-06-13 MED ORDER — METFORMIN HCL 1000 MG PO TABS: ORAL_TABLET | ORAL | 2 refills | Status: DC

## 2017-06-25 ENCOUNTER — Telehealth: Payer: Self-pay | Admitting: Physician Assistant

## 2017-06-25 NOTE — Telephone Encounter (Signed)
Letter written and faxed to [facility] per patients request

## 2017-07-01 ENCOUNTER — Ambulatory Visit: Payer: 59 | Admitting: Physician Assistant

## 2017-07-02 ENCOUNTER — Encounter: Payer: Self-pay | Admitting: Physician Assistant

## 2017-07-16 ENCOUNTER — Other Ambulatory Visit: Payer: Self-pay | Admitting: *Deleted

## 2017-07-16 MED ORDER — DULAGLUTIDE 1.5 MG/0.5ML ~~LOC~~ SOAJ: 1.5000 mg | SUBCUTANEOUS | 1 refills | Status: DC

## 2017-07-21 ENCOUNTER — Other Ambulatory Visit: Payer: Self-pay | Admitting: Physician Assistant

## 2017-07-21 DIAGNOSIS — F411 Generalized anxiety disorder: Secondary | ICD-10-CM

## 2017-07-21 NOTE — Telephone Encounter (Signed)
Last seen 04/03/17  Dr Burney Gauze PCP  If approved route to nurse to call into [facility]

## 2017-07-21 NOTE — Telephone Encounter (Signed)
Rx called to pharmacy

## 2017-07-22 ENCOUNTER — Telehealth: Payer: Self-pay | Admitting: Physician Assistant

## 2017-07-22 MED ORDER — AZITHROMYCIN 250 MG PO TABS: ORAL_TABLET | ORAL | 0 refills | Status: DC

## 2017-07-22 NOTE — Telephone Encounter (Signed)
Patient aware.

## 2017-07-22 NOTE — Telephone Encounter (Signed)
Na/ww 11/20

## 2017-08-04 ENCOUNTER — Encounter: Payer: Self-pay | Admitting: Physician Assistant

## 2017-08-04 ENCOUNTER — Ambulatory Visit (INDEPENDENT_AMBULATORY_CARE_PROVIDER_SITE_OTHER): Payer: 59 | Admitting: Physician Assistant

## 2017-08-04 VITALS — BP 118/70 | HR 83 | Ht 62.0 in | Wt 189.6 lb

## 2017-08-04 DIAGNOSIS — E109 Type 1 diabetes mellitus without complications: Secondary | ICD-10-CM | POA: Diagnosis not present

## 2017-08-04 DIAGNOSIS — E781 Pure hyperglyceridemia: Secondary | ICD-10-CM | POA: Diagnosis not present

## 2017-08-04 DIAGNOSIS — J3089 Other allergic rhinitis: Secondary | ICD-10-CM

## 2017-08-04 DIAGNOSIS — I10 Essential (primary) hypertension: Secondary | ICD-10-CM

## 2017-08-04 LAB — CMP14+EGFR
A/G RATIO: 2.1 (ref 1.2–2.2)
ALT: 12 IU/L (ref 0–32)
AST: 11 IU/L (ref 0–40)
Albumin: 4.5 g/dL (ref 3.5–5.5)
Alkaline Phosphatase: 67 IU/L (ref 39–117)
BUN/Creatinine Ratio: 23 (ref 9–23)
BUN: 18 mg/dL (ref 6–24)
Bilirubin Total: 0.3 mg/dL (ref 0.0–1.2)
CALCIUM: 9.7 mg/dL (ref 8.7–10.2)
CO2: 19 mmol/L — AB (ref 20–29)
CREATININE: 0.79 mg/dL (ref 0.57–1.00)
Chloride: 100 mmol/L (ref 96–106)
GFR, EST AFRICAN AMERICAN: 102 mL/min/{1.73_m2} (ref >59)
GFR, EST NON AFRICAN AMERICAN: 89 mL/min/{1.73_m2} (ref >59)
Globulin, Total: 2.1 g/dL (ref 1.5–4.5)
Glucose: 276 mg/dL — ABNORMAL HIGH (ref 65–99)
Potassium: 4.1 mmol/L (ref 3.5–5.2)
Sodium: 136 mmol/L (ref 134–144)
TOTAL PROTEIN: 6.6 g/dL (ref 6.0–8.5)

## 2017-08-04 LAB — LIPID PANEL
CHOL/HDL RATIO: 4.9 ratio — AB (ref 0.0–4.4)
Cholesterol, Total: 202 mg/dL — ABNORMAL HIGH (ref 100–199)
HDL: 41 mg/dL (ref >39)
LDL CALC: 112 mg/dL — AB (ref 0–99)
TRIGLYCERIDES: 246 mg/dL — AB (ref 0–149)
VLDL CHOLESTEROL CAL: 49 mg/dL — AB (ref 5–40)

## 2017-08-04 LAB — BAYER DCA HB A1C WAIVED: HB A1C: 8.2 % — AB (ref <7.0)

## 2017-08-04 MED ORDER — METFORMIN HCL 1000 MG PO TABS: ORAL_TABLET | ORAL | 5 refills | Status: DC

## 2017-08-04 MED ORDER — CETIRIZINE HCL 10 MG PO TABS: 10.0000 mg | ORAL_TABLET | Freq: Every day | ORAL | 11 refills | Status: DC

## 2017-08-04 MED ORDER — DULAGLUTIDE 1.5 MG/0.5ML ~~LOC~~ SOAJ: 1.5000 mg | SUBCUTANEOUS | 5 refills | Status: DC

## 2017-08-04 MED ORDER — ESCITALOPRAM OXALATE 20 MG PO TABS: 20.0000 mg | ORAL_TABLET | Freq: Every day | ORAL | 11 refills | Status: AC

## 2017-08-04 NOTE — Progress Notes (Signed)
BP 118/70   Pulse 83   Ht _0  (1.575 m)   Wt 189 lb 9.6 oz (86 kg)   BMI 34.68 kg/m    Subjective:    Patient ID: Marie Roberts, female    DOB: December 24, 1970, 46 y.o.   MRN: [id number]  HPI: Marie Roberts is a 46 y.o. female presenting on 08/04/2017 for Follow-up (3 montth )  This patient comes in for periodic recheck on medications and conditions including diabetes, allergic rhinitis, hypertension, hyperlipidemia.  Overall she is doing well with her medications.  Her Claritin does not seem to be working as well.  We will change her to Zyrtec and see if this improves for her allergies.  Blood pressure is doing very well her sugars have been fairly well controlled.  We will draw an A1c today..   All medications are reviewed today. There are no reports of any problems with the medications. All of the medical conditions are reviewed and updated.  Lab work is reviewed and will be ordered as medically necessary. There are no new problems reported with today's visit.   Relevant past medical, surgical, family and social history reviewed and updated as indicated. Allergies and medications reviewed and updated.  Past Medical History:  Diagnosis Date  . Cervical cancer ([place])   . Diabetes mellitus   . High cholesterol   . Hypertension     Past Surgical History:  Procedure Laterality Date  . ABDOMINAL HYSTERECTOMY    . CARPAL TUNNEL RELEASE    . HAND SURGERY      Review of Systems  Constitutional: Negative.   HENT: Positive for congestion and sneezing.   Eyes: Positive for itching.  Respiratory: Negative.   Gastrointestinal: Negative.   Genitourinary: Negative.     Allergies as of 08/04/2017   No Known Allergies     Medication List        Accurate as of 08/04/17  8:55 AM. Always use your most recent med list.          ALPRAZolam 0.5 MG tablet Commonly known as:  XANAX TAKE 1 TABLET TWICE DAILY AS NEEDED FOR ANXIETY   cetirizine 10 MG tablet Commonly known as:   ZYRTEC Take 1 tablet (10 mg total) by mouth daily.   Dulaglutide 1.5 MG/0.5ML Sopn Commonly known as:  TRULICITY Inject 1.5 mg into the skin once a week.   escitalopram 20 MG tablet Commonly known as:  LEXAPRO Take 1 tablet (20 mg total) by mouth daily.   fenofibrate 160 MG tablet Take 1 tablet (160 mg total) by mouth daily.   hydrochlorothiazide 25 MG tablet Commonly known as:  HYDRODIURIL Take 1 tablet (25 mg total) by mouth daily.   HYDROcodone-homatropine 5-1.5 MG/5ML syrup Commonly known as:  HYCODAN Take 5 mLs by mouth every 8 (eight) hours as needed for cough.   metFORMIN 1000 MG tablet Commonly known as:  GLUCOPHAGE TAKE (1) TABLET TWICE A DAY WITH MEALS (BREAKFAST AND SUPPER)   olmesartan 40 MG tablet Commonly known as:  BENICAR Take 0.5-1 tablets (20-40 mg total) by mouth daily.   phentermine 37.5 MG tablet Commonly known as:  ADIPEX-P Take 1 tablet (37.5 mg total) by mouth daily before breakfast.          Objective:    BP 118/70   Pulse 83   Ht _1  (1.575 m)   Wt 189 lb 9.6 oz (86 kg)   BMI 34.68 kg/m   No Known Allergies  Physical  Exam  Constitutional: She is oriented to person, place, and time. She appears well-developed and well-nourished.  HENT:  Head: Normocephalic and atraumatic.  Right Ear: Tympanic membrane, external ear and ear canal normal.  Left Ear: Tympanic membrane, external ear and ear canal normal.  Nose: Nose normal. No rhinorrhea.  Mouth/Throat: Oropharynx is clear and moist and mucous membranes are normal. No oropharyngeal exudate or posterior oropharyngeal erythema.  Eyes: Conjunctivae and EOM are normal. Pupils are equal, round, and reactive to light.  Neck: Normal range of motion. Neck supple.  Cardiovascular: Normal rate, regular rhythm, normal heart sounds and intact distal pulses.  Pulmonary/Chest: Effort normal and breath sounds normal.  Abdominal: Soft. Bowel sounds are normal.  Neurological: She is alert and  oriented to person, place, and time. She has normal reflexes.  Skin: Skin is warm and dry. No rash noted.  Psychiatric: She has a normal mood and affect. Her behavior is normal. Judgment and thought content normal.    Results for orders placed or performed in visit on 05/20/17  CMP14+EGFR  Result Value Ref Range   Glucose 197 (H) 65 - 99 mg/dL   BUN 14 6 - 24 mg/dL   Creatinine, Ser 0.80 0.57 - 1.00 mg/dL   GFR calc non Af Amer 88 >59 mL/min/1.73   GFR calc Af Amer 102 >59 mL/min/1.73   BUN/Creatinine Ratio 18 9 - 23   Sodium 136 134 - 144 mmol/L   Potassium 4.5 3.5 - 5.2 mmol/L   Chloride 102 96 - 106 mmol/L   CO2 19 (L) 20 - 29 mmol/L   Calcium 9.6 8.7 - 10.2 mg/dL   Total Protein 6.8 6.0 - 8.5 g/dL   Albumin 4.4 3.5 - 5.5 g/dL   Globulin, Total 2.4 1.5 - 4.5 g/dL   Albumin/Globulin Ratio 1.8 1.2 - 2.2   Bilirubin Total 0.3 0.0 - 1.2 mg/dL   Alkaline Phosphatase 47 39 - 117 IU/L   AST 13 0 - 40 IU/L   ALT 9 0 - 32 IU/L  Lipid panel  Result Value Ref Range   Cholesterol, Total 198 100 - 199 mg/dL   Triglycerides 220 (H) 0 - 149 mg/dL   HDL 44 >39 mg/dL   VLDL Cholesterol Cal 44 (H) 5 - 40 mg/dL   LDL Calculated 110 (H) 0 - 99 mg/dL   Chol/HDL Ratio 4.5 (H) 0.0 - 4.4 ratio  Bayer DCA Hb A1c Waived  Result Value Ref Range   Bayer DCA Hb A1c Waived 8.1 (H) <7.0 %      Assessment & Plan:   1. Type 1 diabetes mellitus without complication (HCC) - JOA41+YSAY - Lipid panel - Bayer DCA Hb A1c Waived  2. Chronic nonseasonal allergic rhinitis due to pollen  3. Essential hypertension - CMP14+EGFR - Lipid panel - Bayer DCA Hb A1c Waived  4. Hypertriglyceridemia - Lipid panel    Current Outpatient Medications:  .  ALPRAZolam (XANAX) 0.5 MG tablet, TAKE 1 TABLET TWICE DAILY AS NEEDED FOR ANXIETY, Disp: 60 tablet, Rfl: 0 .  Dulaglutide (TRULICITY) 1.5 TK/1.6WF SOPN, Inject 1.5 mg into the skin once a week., Disp: 4 pen, Rfl: 5 .  escitalopram (LEXAPRO) 20 MG tablet,  Take 1 tablet (20 mg total) by mouth daily., Disp: 30 tablet, Rfl: 11 .  fenofibrate 160 MG tablet, Take 1 tablet (160 mg total) by mouth daily., Disp: 30 tablet, Rfl: 11 .  hydrochlorothiazide (HYDRODIURIL) 25 MG tablet, Take 1 tablet (25 mg total) by mouth daily., Disp:  90 tablet, Rfl: 3 .  metFORMIN (GLUCOPHAGE) 1000 MG tablet, TAKE (1) TABLET TWICE A DAY WITH MEALS (BREAKFAST AND SUPPER), Disp: 60 tablet, Rfl: 5 .  olmesartan (BENICAR) 40 MG tablet, Take 0.5-1 tablets (20-40 mg total) by mouth daily., Disp: 30 tablet, Rfl: 11 .  phentermine (ADIPEX-P) 37.5 MG tablet, Take 1 tablet (37.5 mg total) by mouth daily before breakfast., Disp: 30 tablet, Rfl: 2 .  cetirizine (ZYRTEC) 10 MG tablet, Take 1 tablet (10 mg total) by mouth daily., Disp: 30 tablet, Rfl: 11 .  HYDROcodone-homatropine (HYCODAN) 5-1.5 MG/5ML syrup, Take 5 mLs by mouth every 8 (eight) hours as needed for cough. (Patient not taking: Reported on 08/04/2017), Disp: 120 mL, Rfl: 0 Continue all other maintenance medications as listed above.  Follow up plan: Recheck 3 months  Educational handout given for carb counting Terald Sleeper PA-C [facility] [address]  [place], [place] [address] [phone]   08/04/2017, 8:55 AM

## 2017-08-04 NOTE — Patient Instructions (Signed)

## 2017-09-19 ENCOUNTER — Ambulatory Visit: Payer: 59 | Admitting: Physician Assistant

## 2017-09-19 ENCOUNTER — Encounter: Payer: Self-pay | Admitting: Physician Assistant

## 2017-09-19 VITALS — BP 110/72 | HR 80 | Temp 98.4°F | Ht 62.0 in | Wt 190.8 lb

## 2017-09-19 DIAGNOSIS — M79671 Pain in right foot: Secondary | ICD-10-CM | POA: Diagnosis not present

## 2017-09-19 DIAGNOSIS — E109 Type 1 diabetes mellitus without complications: Secondary | ICD-10-CM | POA: Diagnosis not present

## 2017-09-19 DIAGNOSIS — R635 Abnormal weight gain: Secondary | ICD-10-CM

## 2017-09-19 DIAGNOSIS — F411 Generalized anxiety disorder: Secondary | ICD-10-CM

## 2017-09-19 DIAGNOSIS — G8929 Other chronic pain: Secondary | ICD-10-CM | POA: Insufficient documentation

## 2017-09-19 DIAGNOSIS — I10 Essential (primary) hypertension: Secondary | ICD-10-CM | POA: Diagnosis not present

## 2017-09-19 DIAGNOSIS — M542 Cervicalgia: Secondary | ICD-10-CM

## 2017-09-19 MED ORDER — PHENTERMINE HCL 37.5 MG PO TABS: 37.5000 mg | ORAL_TABLET | Freq: Every day | ORAL | 3 refills | Status: DC

## 2017-09-19 MED ORDER — ALPRAZOLAM 0.5 MG PO TABS: ORAL_TABLET | ORAL | 2 refills | Status: DC

## 2017-09-19 NOTE — Patient Instructions (Addendum)
In a few days you may receive a survey in the mail or online from [employer] regarding your visit with [place] today. Please take a moment to fill this out. Your feedback is very important to our whole office. It can help [place] better understand your needs as well as improve your experience and satisfaction. Thank you for taking your time to complete it. We care about you.  Particia Nearing, PA-C  Work note  Marie Roberts was seen in my clinic on 09/19/2017. She will be out of work 09/19/2017-10/20/2017.  She has medical conditions of diabetes, chronic foot pain, cervical pain.

## 2017-09-19 NOTE — Progress Notes (Signed)
Marie Roberts was seen in my clinic on 09/19/2017. She will be out of work  09/19/2017-10/20/2017.  She has medical conditions of diabetes, chronic foot pain, cervical pain.    BP 110/72   Pulse 80   Temp 98.4 F (36.9 C) (Oral)   Ht '5\' 2"'$  (1.575 m)   Wt 190 lb 12.8 oz (86.5 kg)   BMI 34.90 kg/m    Subjective:    Patient ID: Marie Roberts, female    DOB: 05/09/1971, 47 y.o.   MRN: [id number]  HPI: Marie Roberts is a 47 y.o. female presenting on 09/19/2017 for Leg Pain (right )  Patient coming in with several complaints.  She has chronic right foot pain.  It bothers her tremendously when she stands on it.  After she sits all day and stands on it it is bothering her there.  She also is having significant cervical pain down into the shoulders.  She has never had surgery to these areas.  We will make referrals as needed  She is also having some irregularities to her glucose readings.  They are wildly fluctuating at times.  She has not had any hypoglycemias.  But she has had a few highs into the 300s.  We have checked her blood pressure today.  She has known hypertension, generalized anxiety, weight gain.  She is back up today.  Relevant past medical, surgical, family and social history reviewed and updated as indicated. Allergies and medications reviewed and updated.  Past Medical History:  Diagnosis Date  . Cervical cancer ([place])   . Diabetes mellitus   . High cholesterol   . Hypertension     Past Surgical History:  Procedure Laterality Date  . ABDOMINAL HYSTERECTOMY    . CARPAL TUNNEL RELEASE    . HAND SURGERY      Review of Systems  Constitutional: Negative.  Negative for activity change, fatigue and fever.  HENT: Negative.   Eyes: Negative.   Respiratory: Negative.  Negative for cough.   Cardiovascular: Negative.  Negative for chest pain.  Gastrointestinal: Negative.  Negative for abdominal pain.  Endocrine: Negative.   Genitourinary: Negative.  Negative for  dysuria.  Musculoskeletal: Positive for arthralgias, back pain, gait problem, myalgias, neck pain and neck stiffness.  Skin: Negative.   Neurological: Positive for headaches. Negative for tremors, syncope, speech difficulty and weakness.    Allergies as of 09/19/2017   No Known Allergies     Medication List        Accurate as of 09/19/17  4:00 PM. Always use your most recent med list.          ALPRAZolam 0.5 MG tablet Commonly known as:  XANAX TAKE 1 TABLET TWICE DAILY AS NEEDED FOR ANXIETY   cetirizine 10 MG tablet Commonly known as:  ZYRTEC Take 1 tablet (10 mg total) by mouth daily.   Dulaglutide 1.5 MG/0.5ML Sopn Commonly known as:  TRULICITY Inject 1.5 mg into the skin once a week.   escitalopram 20 MG tablet Commonly known as:  LEXAPRO Take 1 tablet (20 mg total) by mouth daily.   hydrochlorothiazide 25 MG tablet Commonly known as:  HYDRODIURIL Take 1 tablet (25 mg total) by mouth daily.   HYDROcodone-homatropine 5-1.5 MG/5ML syrup Commonly known as:  HYCODAN Take 5 mLs by mouth every 8 (eight) hours as needed for cough.   metFORMIN 1000 MG tablet Commonly known as:  GLUCOPHAGE TAKE (1) TABLET TWICE A DAY WITH MEALS (BREAKFAST AND SUPPER)   olmesartan 40  MG tablet Commonly known as:  BENICAR Take 0.5-1 tablets (20-40 mg total) by mouth daily.   phentermine 37.5 MG tablet Commonly known as:  ADIPEX-P Take 1 tablet (37.5 mg total) by mouth daily before breakfast.          Objective:    BP 110/72   Pulse 80   Temp 98.4 F (36.9 C) (Oral)   Ht '5\' 2"'$  (1.575 m)   Wt 190 lb 12.8 oz (86.5 kg)   BMI 34.90 kg/m   No Known Allergies  Physical Exam  Constitutional: She is oriented to person, place, and time. She appears well-developed and well-nourished. She appears distressed.  HENT:  Head: Normocephalic and atraumatic.  Eyes: Conjunctivae and EOM are normal. Pupils are equal, round, and reactive to light.  Cardiovascular: Normal rate, regular  rhythm, normal heart sounds and intact distal pulses.  Pulmonary/Chest: Effort normal and breath sounds normal.  Abdominal: Soft. Bowel sounds are normal.  Musculoskeletal:       Cervical back: She exhibits decreased range of motion, tenderness, pain and spasm.       Lumbar back: She exhibits decreased range of motion, tenderness, pain and spasm.       Back:       Right foot: There is tenderness. There is normal range of motion, no swelling and no deformity.       Feet:  Neurological: She is alert and oriented to person, place, and time. She has normal reflexes.  Skin: Skin is warm and dry. No rash noted.  Psychiatric: She has a normal mood and affect. Her behavior is normal. Judgment and thought content normal.  Nursing note and vitals reviewed.   Results for orders placed or performed in visit on 08/04/17  CMP14+EGFR  Result Value Ref Range   Glucose 276 (H) 65 - 99 mg/dL   BUN 18 6 - 24 mg/dL   Creatinine, Ser 0.79 0.57 - 1.00 mg/dL   GFR calc non Af Amer 89 >59 mL/min/1.73   GFR calc Af Amer 102 >59 mL/min/1.73   BUN/Creatinine Ratio 23 9 - 23   Sodium 136 134 - 144 mmol/L   Potassium 4.1 3.5 - 5.2 mmol/L   Chloride 100 96 - 106 mmol/L   CO2 19 (L) 20 - 29 mmol/L   Calcium 9.7 8.7 - 10.2 mg/dL   Total Protein 6.6 6.0 - 8.5 g/dL   Albumin 4.5 3.5 - 5.5 g/dL   Globulin, Total 2.1 1.5 - 4.5 g/dL   Albumin/Globulin Ratio 2.1 1.2 - 2.2   Bilirubin Total 0.3 0.0 - 1.2 mg/dL   Alkaline Phosphatase 67 39 - 117 IU/L   AST 11 0 - 40 IU/L   ALT 12 0 - 32 IU/L  Lipid panel  Result Value Ref Range   Cholesterol, Total 202 (H) 100 - 199 mg/dL   Triglycerides 246 (H) 0 - 149 mg/dL   HDL 41 >39 mg/dL   VLDL Cholesterol Cal 49 (H) 5 - 40 mg/dL   LDL Calculated 112 (H) 0 - 99 mg/dL   Chol/HDL Ratio 4.9 (H) 0.0 - 4.4 ratio  Bayer DCA Hb A1c Waived  Result Value Ref Range   Bayer DCA Hb A1c Waived 8.2 (H) <7.0 %      Assessment & Plan:   1. Chronic pain in right foot -  Ambulatory referral to Podiatry Out of work due to above Ice Ibuprofen Rest for 4 weeks  2. Generalized anxiety disorder - ALPRAZolam (XANAX) 0.5 MG tablet; TAKE  1 TABLET TWICE DAILY AS NEEDED FOR ANXIETY  Dispense: 60 tablet; Refill: 2  3. Essential hypertension  4. Type 1 diabetes mellitus without complication (HCC)  5. Cervical pain Out of work due to above Physical therapy exercises Rest Ibuprofen  6. Weight gain - phentermine (ADIPEX-P) 37.5 MG tablet; Take 1 tablet (37.5 mg total) by mouth daily before breakfast.  Dispense: 30 tablet; Refill: 3    Current Outpatient Medications:  .  ALPRAZolam (XANAX) 0.5 MG tablet, TAKE 1 TABLET TWICE DAILY AS NEEDED FOR ANXIETY, Disp: 60 tablet, Rfl: 2 .  cetirizine (ZYRTEC) 10 MG tablet, Take 1 tablet (10 mg total) by mouth daily., Disp: 30 tablet, Rfl: 11 .  Dulaglutide (TRULICITY) 1.5 DI/9.7OE SOPN, Inject 1.5 mg into the skin once a week., Disp: 4 pen, Rfl: 5 .  escitalopram (LEXAPRO) 20 MG tablet, Take 1 tablet (20 mg total) by mouth daily., Disp: 30 tablet, Rfl: 11 .  hydrochlorothiazide (HYDRODIURIL) 25 MG tablet, Take 1 tablet (25 mg total) by mouth daily., Disp: 90 tablet, Rfl: 3 .  HYDROcodone-homatropine (HYCODAN) 5-1.5 MG/5ML syrup, Take 5 mLs by mouth every 8 (eight) hours as needed for cough., Disp: 120 mL, Rfl: 0 .  metFORMIN (GLUCOPHAGE) 1000 MG tablet, TAKE (1) TABLET TWICE A DAY WITH MEALS (BREAKFAST AND SUPPER), Disp: 60 tablet, Rfl: 5 .  olmesartan (BENICAR) 40 MG tablet, Take 0.5-1 tablets (20-40 mg total) by mouth daily., Disp: 30 tablet, Rfl: 11 .  phentermine (ADIPEX-P) 37.5 MG tablet, Take 1 tablet (37.5 mg total) by mouth daily before breakfast., Disp: 30 tablet, Rfl: 3 Continue all other maintenance medications as listed above.  Follow up plan: Return in about 4 weeks (around 10/17/2017) for recheck.  Educational handout given for back exercises  Terald Sleeper PA-C [facility] [address]  [place], [place] [address] [phone]   09/19/2017, 4:00 PM

## 2017-09-30 ENCOUNTER — Telehealth: Payer: Self-pay | Admitting: Physician Assistant

## 2017-09-30 ENCOUNTER — Other Ambulatory Visit: Payer: Self-pay | Admitting: Physician Assistant

## 2017-09-30 DIAGNOSIS — I10 Essential (primary) hypertension: Secondary | ICD-10-CM

## 2017-09-30 MED ORDER — TRAMADOL HCL 50 MG PO TABS: 50.0000 mg | ORAL_TABLET | Freq: Four times a day (QID) | ORAL | 2 refills | Status: DC | PRN

## 2017-09-30 NOTE — Telephone Encounter (Signed)
sent 

## 2017-10-02 ENCOUNTER — Other Ambulatory Visit: Payer: Self-pay | Admitting: Physician Assistant

## 2017-10-02 ENCOUNTER — Telehealth: Payer: Self-pay | Admitting: Physician Assistant

## 2017-10-02 MED ORDER — TRAZODONE HCL 50 MG PO TABS: 50.0000 mg | ORAL_TABLET | Freq: Every day | ORAL | 5 refills | Status: DC

## 2017-10-02 NOTE — Telephone Encounter (Signed)
sent 

## 2017-10-03 MED ORDER — OLMESARTAN MEDOXOMIL 40 MG PO TABS: 20.0000 mg | ORAL_TABLET | Freq: Every day | ORAL | 11 refills | Status: DC

## 2017-10-03 NOTE — Telephone Encounter (Signed)
Pt wants a new rx for benicar send to [facility].

## 2017-10-03 NOTE — Telephone Encounter (Signed)
rx sent pt aware 

## 2017-10-21 ENCOUNTER — Telehealth: Payer: Self-pay

## 2017-10-21 NOTE — Telephone Encounter (Signed)
yes

## 2017-10-21 NOTE — Telephone Encounter (Signed)
Pt is needing to extend short term disability for 1 more week. Pt saw Dr. Irving Shows and he states he could not fill out paperwork since we started it.

## 2017-10-21 NOTE — Telephone Encounter (Signed)
Patient is going to call [facility] and have them send new forms or if they need a letter. Patient will call us back and let us know.

## 2017-11-17 ENCOUNTER — Encounter: Payer: 59 | Admitting: Physician Assistant

## 2017-11-19 ENCOUNTER — Other Ambulatory Visit: Payer: Self-pay | Admitting: Physician Assistant

## 2017-11-19 MED ORDER — MELOXICAM 7.5 MG PO TABS: 7.5000 mg | ORAL_TABLET | Freq: Every day | ORAL | 5 refills | Status: DC

## 2017-11-27 ENCOUNTER — Encounter: Payer: Self-pay | Admitting: Physician Assistant

## 2017-11-27 ENCOUNTER — Ambulatory Visit (INDEPENDENT_AMBULATORY_CARE_PROVIDER_SITE_OTHER): Payer: 59 | Admitting: Physician Assistant

## 2017-11-27 VITALS — BP 125/83 | HR 95 | Temp 98.2°F | Ht 62.0 in | Wt 191.2 lb

## 2017-11-27 DIAGNOSIS — F321 Major depressive disorder, single episode, moderate: Secondary | ICD-10-CM | POA: Insufficient documentation

## 2017-11-27 DIAGNOSIS — Z0000 Encounter for general adult medical examination without abnormal findings: Secondary | ICD-10-CM | POA: Diagnosis not present

## 2017-11-27 DIAGNOSIS — E785 Hyperlipidemia, unspecified: Secondary | ICD-10-CM

## 2017-11-27 DIAGNOSIS — E109 Type 1 diabetes mellitus without complications: Secondary | ICD-10-CM

## 2017-11-27 DIAGNOSIS — E1169 Type 2 diabetes mellitus with other specified complication: Secondary | ICD-10-CM | POA: Insufficient documentation

## 2017-11-27 DIAGNOSIS — I10 Essential (primary) hypertension: Secondary | ICD-10-CM

## 2017-11-27 DIAGNOSIS — F411 Generalized anxiety disorder: Secondary | ICD-10-CM

## 2017-11-27 NOTE — Progress Notes (Signed)
BP 125/83   Pulse 95   Temp 98.2 F (36.8 C) (Oral)   Ht _0  (1.575 m)   Wt 191 lb 3.2 oz (86.7 kg)   BMI 34.97 kg/m    Subjective:    Patient ID: Marie Roberts, female    DOB: [birth year], 47 y.o.   MRN: [id number]  HPI: Marie Roberts is a 47 y.o. female presenting on 11/27/2017 for Annual Exam  This patient comes in for annual well physical examination. All medications are reviewed today. There are no reports of any problems with the medications. All of the medical conditions are reviewed and updated.  Lab work is reviewed and will be ordered as medically necessary. There are no new problems reported with today's visit.  Patient reports doing well overall.   Past Medical History:  Diagnosis Date  . Cervical cancer ([place])   . Diabetes mellitus   . High cholesterol   . Hypertension    Relevant past medical, surgical, family and social history reviewed and updated as indicated. Interim medical history since our last visit reviewed. Allergies and medications reviewed and updated. DATA REVIEWED: CHART IN EPIC  Family History reviewed for pertinent findings.  Review of Systems  Constitutional: Negative.  Negative for activity change, fatigue and fever.  HENT: Negative.   Eyes: Negative.   Respiratory: Negative.  Negative for cough.   Cardiovascular: Negative.  Negative for chest pain.  Gastrointestinal: Negative.  Negative for abdominal pain.  Endocrine: Negative.   Genitourinary: Negative.  Negative for dysuria.  Musculoskeletal: Negative.   Skin: Negative.   Neurological: Negative.   Psychiatric/Behavioral: Positive for decreased concentration and dysphoric mood.    Allergies as of 11/27/2017   No Known Allergies     Medication List        Accurate as of 11/27/17  5:00 PM. Always use your most recent med list.          ALPRAZolam 0.5 MG tablet Commonly known as:  XANAX TAKE 1 TABLET TWICE DAILY AS NEEDED FOR ANXIETY   cetirizine 10 MG tablet Commonly  known as:  ZYRTEC Take 1 tablet (10 mg total) by mouth daily.   Dulaglutide 1.5 MG/0.5ML Sopn Commonly known as:  TRULICITY Inject 1.5 mg into the skin once a week.   escitalopram 20 MG tablet Commonly known as:  LEXAPRO Take 1 tablet (20 mg total) by mouth daily.   hydrochlorothiazide 25 MG tablet Commonly known as:  HYDRODIURIL Take 1 tablet (25 mg total) by mouth daily.   meloxicam 7.5 MG tablet Commonly known as:  MOBIC Take 1 tablet (7.5 mg total) by mouth daily.   metFORMIN 1000 MG tablet Commonly known as:  GLUCOPHAGE TAKE (1) TABLET TWICE A DAY WITH MEALS (BREAKFAST AND SUPPER)   olmesartan 40 MG tablet Commonly known as:  BENICAR Take 0.5-1 tablets (20-40 mg total) by mouth daily.   phentermine 37.5 MG tablet Commonly known as:  ADIPEX-P Take 1 tablet (37.5 mg total) by mouth daily before breakfast.   traMADol 50 MG tablet Commonly known as:  ULTRAM Take 1 tablet (50 mg total) by mouth every 6 (six) hours as needed for moderate pain.   traZODone 50 MG tablet Commonly known as:  DESYREL Take 1-2 tablets (50-100 mg total) by mouth at bedtime.          Objective:    BP 125/83   Pulse 95   Temp 98.2 F (36.8 C) (Oral)   Ht _1  (1.575 m)  Wt 191 lb 3.2 oz (86.7 kg)   BMI 34.97 kg/m   No Known Allergies  Wt Readings from Last 3 Encounters:  11/27/17 191 lb 3.2 oz (86.7 kg)  09/19/17 190 lb 12.8 oz (86.5 kg)  08/04/17 189 lb 9.6 oz (86 kg)    Physical Exam  Constitutional: She is oriented to person, place, and time. She appears well-developed and well-nourished.  HENT:  Head: Normocephalic and atraumatic.  Eyes: Pupils are equal, round, and reactive to light. Conjunctivae and EOM are normal.  Cardiovascular: Normal rate, regular rhythm, normal heart sounds and intact distal pulses.  Pulmonary/Chest: Effort normal and breath sounds normal.  Abdominal: Soft. Bowel sounds are normal.  Neurological: She is alert and oriented to person, place, and  time. She has normal reflexes.  Skin: Skin is warm and dry. No rash noted.  Psychiatric: She has a normal mood and affect. Her behavior is normal. Judgment and thought content normal.        Assessment & Plan:   1. Well adult exam - CBC with Differential/Platelet; Future - CMP14+EGFR; Future - Lipid panel; Future - TSH; Future - Bayer DCA Hb A1c Waived; Future  2. Essential hypertension  3. Type 1 diabetes mellitus without complication (HCC) - Bayer DCA Hb A1c Waived; Future  4. Generalized anxiety disorder  5. Depression, major, single episode, moderate ([place])  6. Hyperlipidemia associated with type 2 diabetes mellitus ([place])   Continue all other maintenance medications as listed above.  Follow up plan: No follow-ups on file.  Educational handout given for [place] PA-C [facility] [address]  [place], [place] [address] [phone]   11/27/2017, 5:00 PM

## 2017-12-09 ENCOUNTER — Telehealth: Payer: Self-pay | Admitting: Physician Assistant

## 2017-12-10 ENCOUNTER — Other Ambulatory Visit: Payer: Self-pay | Admitting: Physician Assistant

## 2017-12-10 DIAGNOSIS — F411 Generalized anxiety disorder: Secondary | ICD-10-CM

## 2017-12-10 MED ORDER — ALPRAZOLAM 0.5 MG PO TABS: ORAL_TABLET | ORAL | 2 refills | Status: AC

## 2017-12-10 NOTE — Telephone Encounter (Signed)
Patient aware of results.

## 2017-12-10 NOTE — Telephone Encounter (Signed)
Sent!

## 2018-01-22 ENCOUNTER — Other Ambulatory Visit: Payer: Self-pay | Admitting: Physician Assistant

## 2018-01-22 ENCOUNTER — Telehealth: Payer: Self-pay | Admitting: Physician Assistant

## 2018-01-22 MED ORDER — AZITHROMYCIN 250 MG PO TABS: ORAL_TABLET | ORAL | 0 refills | Status: DC

## 2018-01-22 NOTE — Telephone Encounter (Signed)
Patient aware rx sent to pharmacy.  

## 2018-01-22 NOTE — Telephone Encounter (Signed)
PT is calling wanting to know if Glenard Haring could call something into [facility] on [address] in [place] for her, She is having chest congestion, coughing (making her back hurt), body aches, fatigue, and no fever that she knows of. She has taking musinex and tussian DM

## 2018-01-22 NOTE — Telephone Encounter (Signed)
Sent zpak.

## 2018-01-23 ENCOUNTER — Telehealth: Payer: Self-pay | Admitting: Physician Assistant

## 2018-01-23 ENCOUNTER — Telehealth: Payer: Self-pay | Admitting: *Deleted

## 2018-01-23 MED ORDER — LISINOPRIL 40 MG PO TABS: 40.0000 mg | ORAL_TABLET | Freq: Every day | ORAL | 1 refills | Status: DC

## 2018-01-23 NOTE — Telephone Encounter (Signed)
lmtcb

## 2018-01-23 NOTE — Telephone Encounter (Signed)
Fax received [facility] [address] Benicar 40 mg is on backorder Can alternative be provided until it becomes available Please advise

## 2018-01-23 NOTE — Telephone Encounter (Signed)
Patient aware of medication change. 

## 2018-01-23 NOTE — Telephone Encounter (Signed)
Covering for PCP  Any cars on back order, sent to equivalent dosing of lisinopril.  Patient has no history of lisinopril induced cough or use of lisinopril charted.  Laroy Apple, MD [facility] 01/23/2018, 11:37 AM

## 2018-02-04 ENCOUNTER — Other Ambulatory Visit: Payer: Self-pay | Admitting: Physician Assistant

## 2018-02-04 MED ORDER — CETIRIZINE HCL 10 MG PO TABS: 10.0000 mg | ORAL_TABLET | Freq: Every day | ORAL | 11 refills | Status: DC

## 2018-02-04 MED ORDER — METFORMIN HCL 1000 MG PO TABS: ORAL_TABLET | ORAL | 5 refills | Status: AC

## 2018-02-04 NOTE — Addendum Note (Signed)
Addended by: Thana Ates on: 02/04/2018 12:21 PM   Modules accepted: Orders

## 2018-02-04 NOTE — Telephone Encounter (Signed)
Patient aware rx sent  

## 2018-03-03 ENCOUNTER — Encounter: Payer: Self-pay | Admitting: Physician Assistant

## 2018-03-03 ENCOUNTER — Ambulatory Visit (INDEPENDENT_AMBULATORY_CARE_PROVIDER_SITE_OTHER): Payer: 59 | Admitting: Physician Assistant

## 2018-03-03 VITALS — BP 121/81 | HR 77 | Temp 97.4°F | Ht 62.0 in | Wt 191.0 lb

## 2018-03-03 DIAGNOSIS — E1169 Type 2 diabetes mellitus with other specified complication: Secondary | ICD-10-CM

## 2018-03-03 DIAGNOSIS — E785 Hyperlipidemia, unspecified: Secondary | ICD-10-CM | POA: Diagnosis not present

## 2018-03-03 DIAGNOSIS — I10 Essential (primary) hypertension: Secondary | ICD-10-CM | POA: Diagnosis not present

## 2018-03-03 DIAGNOSIS — Z0000 Encounter for general adult medical examination without abnormal findings: Secondary | ICD-10-CM

## 2018-03-03 DIAGNOSIS — E109 Type 1 diabetes mellitus without complications: Secondary | ICD-10-CM

## 2018-03-03 LAB — BAYER DCA HB A1C WAIVED: HB A1C (BAYER DCA - WAIVED): 10.1 % — ABNORMAL HIGH (ref <7.0)

## 2018-03-03 MED ORDER — MOMETASONE FUROATE 50 MCG/ACT NA SUSP: 2.0000 | Freq: Every day | NASAL | 12 refills | Status: DC

## 2018-03-03 NOTE — Patient Instructions (Signed)
In a few days you may receive a survey in the mail or online from [employer] regarding your visit with us today. Please take a moment to fill this out. Your feedback is very important to our whole office. It can help us better understand your needs as well as improve your experience and satisfaction. Thank you for taking your time to complete it. We care about you.  Keta [place], PA-C  

## 2018-03-04 ENCOUNTER — Other Ambulatory Visit: Payer: Self-pay | Admitting: Physician Assistant

## 2018-03-04 ENCOUNTER — Telehealth: Payer: Self-pay | Admitting: *Deleted

## 2018-03-04 LAB — CMP14+EGFR
ALBUMIN: 4.4 g/dL (ref 3.5–5.5)
ALK PHOS: 90 IU/L (ref 39–117)
ALT: 20 IU/L (ref 0–32)
AST: 15 IU/L (ref 0–40)
Albumin/Globulin Ratio: 1.9 (ref 1.2–2.2)
BUN / CREAT RATIO: 24 — AB (ref 9–23)
BUN: 16 mg/dL (ref 6–24)
Bilirubin Total: 0.3 mg/dL (ref 0.0–1.2)
CO2: 19 mmol/L — AB (ref 20–29)
CREATININE: 0.68 mg/dL (ref 0.57–1.00)
Calcium: 9.7 mg/dL (ref 8.7–10.2)
Chloride: 96 mmol/L (ref 96–106)
GFR calc Af Amer: 120 mL/min/{1.73_m2} (ref >59)
GFR, EST NON AFRICAN AMERICAN: 104 mL/min/{1.73_m2} (ref >59)
GLOBULIN, TOTAL: 2.3 g/dL (ref 1.5–4.5)
Glucose: 356 mg/dL — ABNORMAL HIGH (ref 65–99)
Potassium: 4.4 mmol/L (ref 3.5–5.2)
Sodium: 131 mmol/L — ABNORMAL LOW (ref 134–144)
Total Protein: 6.7 g/dL (ref 6.0–8.5)

## 2018-03-04 LAB — CBC WITH DIFFERENTIAL/PLATELET
BASOS: 1 %
Basophils Absolute: 0.1 10*3/uL (ref 0.0–0.2)
EOS (ABSOLUTE): 0.2 10*3/uL (ref 0.0–0.4)
EOS: 3 %
HEMATOCRIT: 40.3 % (ref 34.0–46.6)
Hemoglobin: 13.6 g/dL (ref 11.1–15.9)
Immature Grans (Abs): 0 10*3/uL (ref 0.0–0.1)
Immature Granulocytes: 0 %
LYMPHS ABS: 2.4 10*3/uL (ref 0.7–3.1)
Lymphs: 31 %
MCH: 29.8 pg (ref 26.6–33.0)
MCHC: 33.7 g/dL (ref 31.5–35.7)
MCV: 88 fL (ref 79–97)
Monocytes Absolute: 0.5 10*3/uL (ref 0.1–0.9)
Monocytes: 6 %
Neutrophils Absolute: 4.8 10*3/uL (ref 1.4–7.0)
Neutrophils: 59 %
Platelets: 328 10*3/uL (ref 150–450)
RBC: 4.57 x10E6/uL (ref 3.77–5.28)
RDW: 14 % (ref 12.3–15.4)
WBC: 8 10*3/uL (ref 3.4–10.8)

## 2018-03-04 LAB — LIPID PANEL
Chol/HDL Ratio: 12 ratio — ABNORMAL HIGH (ref 0.0–4.4)
Cholesterol, Total: 335 mg/dL — ABNORMAL HIGH (ref 100–199)
HDL: 28 mg/dL — ABNORMAL LOW (ref >39)
Triglycerides: 1302 mg/dL (ref 0–149)

## 2018-03-04 LAB — TSH: TSH: 1.83 u[IU]/mL (ref 0.450–4.500)

## 2018-03-04 MED ORDER — FLUTICASONE PROPIONATE 50 MCG/ACT NA SUSP: 2.0000 | Freq: Every day | NASAL | 11 refills | Status: DC

## 2018-03-04 NOTE — Telephone Encounter (Signed)
Fax received [facility] [address] Nasonex not covered, Fluticasone is an alternative Please advise

## 2018-03-08 NOTE — Progress Notes (Signed)
BP 121/81   Pulse 77   Temp (!) 97.4 F (36.3 C) (Oral)   Ht 5' 2" (1.575 m)   Wt 191 lb (86.6 kg)   BMI 34.93 kg/m    Subjective:    Patient ID: Marie Roberts, female    DOB: [birth year], 47 y.o.   MRN: [id number]  HPI: Marie Roberts is a 47 y.o. female presenting on 03/03/2018 for Diabetes  This patient comes in for a 65-monthrecheck on her chronic medications and conditions.  She does have hypertension, diabetes, depression with some anxiety.  She reports overall she is doing fairly well and not having any difficulties.  She admits that her A1c will probably be elevated.  She has changed her her living situation and is trying to get back on track with diet and exercise choices.  Past Medical History:  Diagnosis Date  . [place]   . Diabetes mellitus   . High cholesterol   . Hypertension    Relevant past medical, surgical, family and social history reviewed and updated as indicated. Interim medical history since our last visit reviewed. Allergies and medications reviewed and updated. DATA REVIEWED: CHART IN EPIC  Family History reviewed for pertinent findings.  Review of Systems  Constitutional: Negative.  Negative for activity change, fatigue and fever.  HENT: Negative.   Eyes: Negative.   Respiratory: Negative.  Negative for cough.   Cardiovascular: Negative.  Negative for chest pain.  Gastrointestinal: Negative.  Negative for abdominal pain.  Endocrine: Negative.   Genitourinary: Negative.  Negative for dysuria.  Musculoskeletal: Negative.   Skin: Negative.   Neurological: Negative.     Allergies as of 03/03/2018   No Known Allergies     Medication List        Accurate as of 03/03/18 11:59 PM. Always use your most recent med list.          ALPRAZolam 0.5 MG tablet Commonly known as:  XANAX TAKE 1 TABLET TWICE DAILY AS NEEDED FOR ANXIETY   BENICAR 40 MG tablet Generic drug:  olmesartan Take 20 mg by mouth daily.   cetirizine 10 MG  tablet Commonly known as:  ZYRTEC Take 1 tablet (10 mg total) by mouth daily.   Dulaglutide 1.5 MG/0.5ML Sopn Commonly known as:  TRULICITY Inject 1.5 mg into the skin once a week.   escitalopram 20 MG tablet Commonly known as:  LEXAPRO Take 1 tablet (20 mg total) by mouth daily.   hydrochlorothiazide 25 MG tablet Commonly known as:  HYDRODIURIL Take 1 tablet (25 mg total) by mouth daily.   meloxicam 7.5 MG tablet Commonly known as:  MOBIC Take 1 tablet (7.5 mg total) by mouth daily.   metFORMIN 1000 MG tablet Commonly known as:  GLUCOPHAGE TAKE (1) TABLET TWICE A DAY WITH MEALS (BREAKFAST AND SUPPER)   mometasone 50 MCG/ACT nasal spray Commonly known as:  NASONEX Place 2 sprays into the nose daily.   traMADol 50 MG tablet Commonly known as:  ULTRAM Take 1 tablet (50 mg total) by mouth every 6 (six) hours as needed for moderate pain.   traZODone 50 MG tablet Commonly known as:  DESYREL Take 1-2 tablets (50-100 mg total) by mouth at bedtime.          Objective:    BP 121/81   Pulse 77   Temp (!) 97.4 F (36.3 C) (Oral)   Ht 5' 2" (1.575 m)   Wt 191 lb (86.6 kg)   BMI 34.93  kg/m   No Known Allergies  Wt Readings from Last 3 Encounters:  03/03/18 191 lb (86.6 kg)  11/27/17 191 lb 3.2 oz (86.7 kg)  09/19/17 190 lb 12.8 oz (86.5 kg)    Physical Exam  Constitutional: She is oriented to person, place, and time. She appears well-developed and well-nourished.  HENT:  Head: Normocephalic and atraumatic.  Eyes: Pupils are equal, round, and reactive to light. Conjunctivae and EOM are normal.  Cardiovascular: Normal rate, regular rhythm, normal heart sounds and intact distal pulses.  Pulmonary/Chest: Effort normal and breath sounds normal.  Abdominal: Soft. Bowel sounds are normal.  Neurological: She is alert and oriented to person, place, and time. She has normal reflexes.  Skin: Skin is warm and dry. No rash noted.  Psychiatric: She has a normal mood and  affect. Her behavior is normal. Judgment and thought content normal.    Results for orders placed or performed in visit on 03/03/18  CMP14+EGFR  Result Value Ref Range   Glucose 356 (H) 65 - 99 mg/dL   BUN 16 6 - 24 mg/dL   Creatinine, Ser 0.68 0.57 - 1.00 mg/dL   GFR calc non Af Amer 104 >59 mL/min/1.73   GFR calc Af Amer 120 >59 mL/min/1.73   BUN/Creatinine Ratio 24 (H) 9 - 23   Sodium 131 (L) 134 - 144 mmol/L   Potassium 4.4 3.5 - 5.2 mmol/L   Chloride 96 96 - 106 mmol/L   CO2 19 (L) 20 - 29 mmol/L   Calcium 9.7 8.7 - 10.2 mg/dL   Total Protein 6.7 6.0 - 8.5 g/dL   Albumin 4.4 3.5 - 5.5 g/dL   Globulin, Total 2.3 1.5 - 4.5 g/dL   Albumin/Globulin Ratio 1.9 1.2 - 2.2   Bilirubin Total 0.3 0.0 - 1.2 mg/dL   Alkaline Phosphatase 90 39 - 117 IU/L   AST 15 0 - 40 IU/L   ALT 20 0 - 32 IU/L  CBC with Differential/Platelet  Result Value Ref Range   WBC 8.0 3.4 - 10.8 x10E3/uL   RBC 4.57 3.77 - 5.28 x10E6/uL   Hemoglobin 13.6 11.1 - 15.9 g/dL   Hematocrit 40.3 34.0 - 46.6 %   MCV 88 79 - 97 fL   MCH 29.8 26.6 - 33.0 pg   MCHC 33.7 31.5 - 35.7 g/dL   RDW 14.0 12.3 - 15.4 %   Platelets 328 150 - 450 x10E3/uL   Neutrophils 59 Not Estab. %   Lymphs 31 Not Estab. %   Monocytes 6 Not Estab. %   Eos 3 Not Estab. %   Basos 1 Not Estab. %   Neutrophils Absolute 4.8 1.4 - 7.0 x10E3/uL   Lymphocytes Absolute 2.4 0.7 - 3.1 x10E3/uL   Monocytes Absolute 0.5 0.1 - 0.9 x10E3/uL   EOS (ABSOLUTE) 0.2 0.0 - 0.4 x10E3/uL   Basophils Absolute 0.1 0.0 - 0.2 x10E3/uL   Immature Granulocytes 0 Not Estab. %   Immature Grans (Abs) 0.0 0.0 - 0.1 x10E3/uL  Lipid panel  Result Value Ref Range   Cholesterol, Total 335 (H) 100 - 199 mg/dL   Triglycerides 1,302 (HH) 0 - 149 mg/dL   HDL 28 (L) >39 mg/dL   VLDL Cholesterol Cal Comment 5 - 40 mg/dL   LDL Calculated Comment 0 - 99 mg/dL   Chol/HDL Ratio 12.0 (H) 0.0 - 4.4 ratio  Bayer DCA Hb A1c Waived  Result Value Ref Range   HB A1C (BAYER DCA -  WAIVED) 10.1 (H) <7.0 %  TSH  Result Value Ref Range   TSH 1.830 0.450 - 4.500 uIU/mL      Assessment & Plan:   1. Essential hypertension Continue diet and exercise  2. Type 1 diabetes mellitus without complication (HCC) - Bayer DCA Hb A1c Waived  3. Hyperlipidemia associated with type 2 diabetes mellitus ([place]) - Lipid panel  4. Well adult exam - CMP14+EGFR - CBC with Differential/Platelet - Lipid panel - Bayer DCA Hb A1c Waived - TSH   Continue all other maintenance medications as listed above.  Follow up plan: Return in about 4 months (around 07/04/2018) for recheck.  Educational handout given for [place] PA-C [facility] [address]  [place],  [address] [phone]   03/08/2018, 8:43 PM

## 2018-03-10 ENCOUNTER — Telehealth: Payer: Self-pay | Admitting: Physician Assistant

## 2018-03-10 NOTE — Telephone Encounter (Signed)
Patient aware of labs.  She reports she had been on Fenofibrate in the past but had to stop because of body aches, joint pain.  She also said sometimes she will forget her night dose of Metformin and has had a week or two of forgetting her Trulicity.  She is willing to try another cholesterol medication and said she will adhere to her medications more.  She has a past due balance and is "dismissed" but has left a message for Rhonda Case to contact her about setting up a payment plan.  She was unsure if this would restrict you from sending in a new prescription for her.

## 2018-03-11 NOTE — Telephone Encounter (Signed)
Recommend sending Vascepa 1 g 2 capsules BID #120.  Needs a copay card to get reduction in price. Insurance may not pay. Recheck labs 3 months

## 2018-03-18 NOTE — Telephone Encounter (Signed)
lmtcb- no call back. This encounter will be closed.

## 2018-07-06 ENCOUNTER — Ambulatory Visit: Payer: 59 | Admitting: Physician Assistant

## 2018-07-09 ENCOUNTER — Encounter: Payer: Self-pay | Admitting: Physician Assistant

## 2019-03-01 ENCOUNTER — Telehealth: Payer: Self-pay | Admitting: Physician Assistant

## 2019-03-01 ENCOUNTER — Encounter: Payer: Self-pay | Admitting: Physician Assistant

## 2019-03-01 DIAGNOSIS — B373 Candidiasis of vulva and vagina: Secondary | ICD-10-CM

## 2019-03-01 DIAGNOSIS — B3731 Acute candidiasis of vulva and vagina: Secondary | ICD-10-CM

## 2019-03-01 MED ORDER — FLUCONAZOLE 150 MG PO TABS: 150.0000 mg | ORAL_TABLET | Freq: Once | ORAL | 0 refills | Status: AC

## 2019-03-01 NOTE — Progress Notes (Signed)
We are sorry that you are not feeling well. Here is how we plan to help! Based on what you shared with me it looks like you: May have a yeast vaginosis  Vaginosis is an inflammation of the vagina that can result in discharge, itching and pain. The cause is usually a change in the normal balance of vaginal bacteria or an infection. Vaginosis can also result from reduced estrogen levels after menopause.  Marie Roberts, Yeast infection may cause irritation in the skin and when urinating in irritated skin, this can cause burning. If your burning with urination is on the inside, then that can be  Sign of UTI and different medicine would be indicated.   The most common causes of vaginosis are:   Bacterial vaginosis which results from an overgrowth of one on several organisms that are normally present in your vagina.   Yeast infections which are caused by a naturally occurring fungus called candida.   Vaginal atrophy (atrophic vaginosis) which results from the thinning of the vagina from reduced estrogen levels after menopause.   Trichomoniasis which is caused by a parasite and is commonly transmitted by sexual intercourse.  Factors that increase your risk of developing vaginosis include: Marland Kitchen Medications, such as antibiotics and steroids . Uncontrolled diabetes . Use of hygiene products such as bubble bath, vaginal spray or vaginal deodorant . Douching . Wearing damp or tight-fitting clothing . Using an intrauterine device (IUD) for birth control . Hormonal changes, such as those associated with pregnancy, birth control pills or menopause . Sexual activity . Having a sexually transmitted infection  Your treatment plan is A single Diflucan (fluconazole) 150mg  tablet once.  I have electronically sent this prescription into the pharmacy that you have chosen.  Be sure to take all of the medication as directed. Stop taking any medication if you develop a rash, tongue swelling or shortness of breath.  Mothers who are breast feeding should consider pumping and discarding their breast milk while on these antibiotics. However, there is no consensus that infant exposure at these doses would be harmful.  Remember that medication creams can weaken latex condoms. Marland Kitchen   HOME CARE:  Good hygiene may prevent some types of vaginosis from recurring and may relieve some symptoms:  . Avoid baths, hot tubs and whirlpool spas. Rinse soap from your outer genital area after a shower, and dry the area well to prevent irritation. Don't use scented or harsh soaps, such as those with deodorant or antibacterial action. Marland Kitchen Avoid irritants. These include scented tampons and pads. . Wipe from front to back after using the toilet. Doing so avoids spreading fecal bacteria to your vagina.  Other things that may help prevent vaginosis include:  Marland Kitchen Don't douche. Your vagina doesn't require cleansing other than normal bathing. Repetitive douching disrupts the normal organisms that reside in the vagina and can actually increase your risk of vaginal infection. Douching won't clear up a vaginal infection. . Use a latex condom. Both female and female latex condoms may help you avoid infections spread by sexual contact. . Wear cotton underwear. Also wear pantyhose with a cotton crotch. If you feel comfortable without it, skip wearing underwear to bed. Yeast thrives in [employer] Your symptoms should improve in the next day or two.  GET HELP RIGHT AWAY IF:  . You have pain in your lower abdomen ( pelvic area or over your ovaries) . You develop nausea or vomiting . You develop a fever . Your discharge changes or worsens .  You have persistent pain with intercourse . You develop shortness of breath, a rapid pulse, or you faint.  These symptoms could be signs of problems or infections that need to be evaluated by a medical provider now.  MAKE SURE YOU    Understand these instructions.  Will watch your  condition.  Will get help right away if you are not doing well or get worse.  Your e-visit answers were reviewed by a board certified advanced clinical practitioner to complete your personal care plan. Depending upon the condition, your plan could have included both over the counter or prescription medications. Please review your pharmacy choice to make sure that you have choses a pharmacy that is open for you to pick up any needed prescription, Your safety is important to [place]. If you have drug allergies check your prescription carefully.   You can use MyChart to ask questions about today's visit, request a non-urgent call back, or ask for a work or school excuse for 24 hours related to this e-Visit. If it has been greater than 24 hours you will need to follow up with your provider, or enter a new e-Visit to address those concerns. You will get a MyChart message within the next two days asking about your experience. I hope that your e-visit has been valuable and will speed your recovery. I spent 5-10 minutes on review and completion of this note- Marie Roberts [facility]

## 2019-10-19 ENCOUNTER — Telehealth: Payer: Self-pay | Admitting: Physician Assistant

## 2019-10-19 NOTE — Telephone Encounter (Signed)
Printed off shot record from [employer] and epic. Called pt and left message

## 2020-08-27 ENCOUNTER — Other Ambulatory Visit: Payer: Self-pay | Admitting: Internal Medicine

## 2020-10-13 ENCOUNTER — Emergency Department (HOSPITAL_BASED_OUTPATIENT_CLINIC_OR_DEPARTMENT_OTHER)
Admission: EM | Admit: 2020-10-13 | Discharge: 2020-10-13 | Disposition: A | Discharge status: Home / Self Care | Payer: Self-pay | Attending: Emergency Medicine | Admitting: Emergency Medicine

## 2020-10-13 ENCOUNTER — Other Ambulatory Visit: Payer: Self-pay

## 2020-10-13 ENCOUNTER — Encounter (HOSPITAL_BASED_OUTPATIENT_CLINIC_OR_DEPARTMENT_OTHER): Payer: Self-pay | Admitting: *Deleted

## 2020-10-13 DIAGNOSIS — I10 Essential (primary) hypertension: Secondary | ICD-10-CM | POA: Insufficient documentation

## 2020-10-13 DIAGNOSIS — N764 Abscess of vulva: Secondary | ICD-10-CM | POA: Insufficient documentation

## 2020-10-13 DIAGNOSIS — L0291 Cutaneous abscess, unspecified: Secondary | ICD-10-CM

## 2020-10-13 DIAGNOSIS — E119 Type 2 diabetes mellitus without complications: Secondary | ICD-10-CM | POA: Insufficient documentation

## 2020-10-13 DIAGNOSIS — Z7984 Long term (current) use of oral hypoglycemic drugs: Secondary | ICD-10-CM | POA: Insufficient documentation

## 2020-10-13 DIAGNOSIS — Z79899 Other long term (current) drug therapy: Secondary | ICD-10-CM | POA: Insufficient documentation

## 2020-10-13 DIAGNOSIS — Z87891 Personal history of nicotine dependence: Secondary | ICD-10-CM | POA: Insufficient documentation

## 2020-10-13 DIAGNOSIS — Z8541 Personal history of malignant neoplasm of cervix uteri: Secondary | ICD-10-CM | POA: Insufficient documentation

## 2020-10-13 MED ORDER — LIDOCAINE-EPINEPHRINE 2 %-1:100000 IJ SOLN: 20.0000 mL | Freq: Once | INTRAMUSCULAR | Status: DC

## 2020-10-13 MED ORDER — LIDOCAINE-EPINEPHRINE-TETRACAINE (LET) TOPICAL GEL
3.0000 mL | Freq: Once | TOPICAL | Status: AC
  Administered 2020-10-13: 3 mL via TOPICAL
  Filled 2020-10-13: qty 3

## 2020-10-13 MED ORDER — DOXYCYCLINE HYCLATE 100 MG PO CAPS: 100.0000 mg | ORAL_CAPSULE | Freq: Two times a day (BID) | ORAL | 0 refills | Status: DC

## 2020-10-13 MED ORDER — DOXYCYCLINE HYCLATE 100 MG PO CAPS: 100.0000 mg | ORAL_CAPSULE | Freq: Two times a day (BID) | ORAL | 0 refills | Status: AC

## 2020-10-13 MED ORDER — LIDOCAINE-EPINEPHRINE 1 %-1:100000 IJ SOLN
INTRAMUSCULAR | Status: AC
  Administered 2020-10-13: 1 mL
  Filled 2020-10-13: qty 1

## 2020-10-13 NOTE — ED Triage Notes (Signed)
C/o abscess to right buttocks x 2 days

## 2020-10-13 NOTE — Discharge Instructions (Signed)
You were given a prescription for antibiotics. Please take the antibiotic prescription fully.   Please follow up with your primary care provider within 5-7 days for re-evaluation of your symptoms. If you do not have a primary care provider, information for a healthcare clinic has been provided for you to make arrangements for follow up care. Please return to the emergency department for any new or worsening symptoms.  

## 2020-10-13 NOTE — ED Provider Notes (Addendum)
[facility] EMERGENCY DEPARTMENT Provider Note   CSN: [id number] Arrival date & time: 10/13/20  1631     History Chief Complaint  Patient presents with  . Abscess    Marie Roberts is a 50 y.o. female.  HPI   50 year old female history of cervical cancer, diabetes, hyperlipidemia, hypertension, who presents to the emergency department today for evaluation of an abscess.  States she has an abscess to the right labial area/buttocks that has been present for the last several days.  She has minimal pain to the area but states it feels similar to when she had a prior abscess that she wanted to come before got worse.  She denies any systemic symptoms or fevers.  She said no drainage from the area.  Past Medical History:  Diagnosis Date  . Cervical cancer ([place])   . Diabetes mellitus   . High cholesterol   . Hypertension     Patient Active Problem List   Diagnosis Date Noted  . Depression, major, single episode, moderate ([place]) 11/27/2017  . Hyperlipidemia associated with type 2 diabetes mellitus ([place]) 11/27/2017  . Cervical pain 09/19/2017  . Chronic pain in right foot 09/19/2017  . Bronchitis 03/31/2017  . Obesity 09/10/2016  . Generalized anxiety disorder 09/04/2016  . Hypertriglyceridemia 09/04/2016  . Chronic nonseasonal allergic rhinitis due to pollen 09/04/2016  . Diabetes ([place]) 04/27/2015  . HTN (hypertension) 04/27/2015    Past Surgical History:  Procedure Laterality Date  . ABDOMINAL HYSTERECTOMY    . CARPAL TUNNEL RELEASE    . HAND SURGERY       OB History   No obstetric history on file.     Family History  Problem Relation Age of Onset  . Diabetes Mother   . Diabetes Father     Social History   Tobacco Use  . Smoking status: Former Research scientist (life sciences)  . Smokeless tobacco: Never Used  Vaping Use  . Vaping Use: Never used  Substance Use Topics  . Alcohol use: No  . Drug use: No    Home Medications Prior to Admission medications   Medication  Sig Start Date End Date Taking? Authorizing Provider  doxycycline (VIBRAMYCIN) 100 MG capsule Take 1 capsule (100 mg total) by mouth 2 (two) times daily for 7 days. 10/13/20 10/20/20 Yes Quamir Willemsen S, PA-C  ALPRAZolam (XANAX) 0.5 MG tablet TAKE 1 TABLET TWICE DAILY AS NEEDED FOR ANXIETY 12/10/17   Terald Sleeper, PA-C  cetirizine (ZYRTEC) 10 MG tablet Take 1 tablet (10 mg total) by mouth daily. 02/04/18   Terald Sleeper, PA-C  Dulaglutide (TRULICITY) 1.5 LF/8.1OF SOPN Inject 1.5 mg into the skin once a week. 08/04/17   Terald Sleeper, PA-C  escitalopram (LEXAPRO) 20 MG tablet Take 1 tablet (20 mg total) by mouth daily. 08/04/17   Terald Sleeper, PA-C  fluticasone (FLONASE) 50 MCG/ACT nasal spray Place 2 sprays into both nostrils daily. 03/04/18   Terald Sleeper, PA-C  hydrochlorothiazide (HYDRODIURIL) 25 MG tablet Take 1 tablet (25 mg total) by mouth daily. 09/04/16   Terald Sleeper, PA-C  meloxicam (MOBIC) 7.5 MG tablet Take 1 tablet (7.5 mg total) by mouth daily. 11/19/17   Terald Sleeper, PA-C  metFORMIN (GLUCOPHAGE) 1000 MG tablet TAKE (1) TABLET TWICE A DAY WITH MEALS (BREAKFAST AND SUPPER) 02/04/18   Terald Sleeper, PA-C  olmesartan (BENICAR) 40 MG tablet Take 20 mg by mouth daily.    [provider]  traMADol (ULTRAM) 50 MG tablet  Take 1 tablet (50 mg total) by mouth every 6 (six) hours as needed for moderate pain. 09/30/17   Terald Sleeper, PA-C  traZODone (DESYREL) 50 MG tablet Take 1-2 tablets (50-100 mg total) by mouth at bedtime. 10/02/17   Terald Sleeper, PA-C    Allergies    Patient has no known allergies.  Review of Systems   Review of Systems  Constitutional: Negative for fever.  Skin:       abscess    Physical Exam Updated Vital Signs BP (!) 154/89 (BP Location: Right Arm)   Pulse 85   Temp 98.5 F (36.9 C)   Resp 18   Ht 5\' 1"  (1.549 m)   Wt 82.6 kg   SpO2 100%   BMI 34.39 kg/m   Physical Exam Vitals and nursing note reviewed.  Constitutional:      General:  She is not in acute distress.    Appearance: She is well-developed and well-nourished.  HENT:     Head: Normocephalic and atraumatic.  Eyes:     Conjunctiva/sclera: Conjunctivae normal.  Cardiovascular:     Rate and Rhythm: Normal rate.  Pulmonary:     Effort: Pulmonary effort is normal.  Genitourinary:    Comments: Chaperone present. 1cm area of fluctuance to the inferior aspect of the right labia. No significant surrounding erythema or induration.  No crepitus. Musculoskeletal:        General: Normal range of motion.     Cervical back: Neck supple.  Skin:    General: Skin is warm and dry.  Neurological:     Mental Status: She is alert.  Psychiatric:        Mood and Affect: Mood and affect normal.     ED Results / Procedures / Treatments   Labs (all labs ordered are listed, but only abnormal results are displayed) Labs Reviewed - No data to display  EKG None  Radiology No results found.  Procedures .Marland KitchenIncision and Drainage  Date/Time: 10/13/2020 11:55 PM Performed by: Rodney Booze, PA-C Authorized by: Rodney Booze, PA-C   Consent:    Consent obtained:  Verbal   Consent given by:  Patient   Risks discussed:  Bleeding, incomplete drainage, pain and damage to other organs   Alternatives discussed:  No treatment Universal protocol:    Procedure explained and questions answered to patient or proxy's satisfaction: yes     Site/side marked: yes     Immediately prior to procedure, a time out was called: yes     Patient identity confirmed:  Verbally with patient Location:    Type:  Abscess Pre-procedure details:    Skin preparation:  Betadine Anesthesia:    Anesthesia method:  Local infiltration and topical application   Local anesthetic:  Lidocaine 2% WITH epi Procedure type:    Complexity:  Simple Procedure details:    Incision types:  Single straight   Incision depth:  Subcutaneous   Scalpel blade:  11   Drainage:  Purulent   Drainage amount:   Scant   Packing materials:  None Post-procedure details:    Procedure completion:  Tolerated well, no immediate complications     Medications Ordered in ED Medications  lidocaine-EPINEPHrine (XYLOCAINE W/EPI) 2 %-1:100000 (with pres) injection 20 mL (20 mLs Intradermal Not Given 10/13/20 1824)  lidocaine-EPINEPHrine-tetracaine (LET) topical gel (3 mLs Topical Given 10/13/20 1823)  lidocaine-EPINEPHrine (XYLOCAINE W/EPI) 1 %-1:100000 (with pres) injection (1 mL  Given by Other 10/13/20 1823)    ED Course  I have reviewed the triage vital signs and the nursing notes.  Pertinent labs & imaging results that were available during my care of the patient were reviewed by me and considered in my medical decision making (see chart for details).    MDM Rules/Calculators/A&P                          Patient with skin abscess amenable to incision and drainage.  Abscess was not large enough to warrant packing or drain. Encouraged home warm soaks and flushing.  Mild signs of cellulitis is surrounding skin.  Will d/c to home.  Will give rx for abx. Advised to return if worse.    Final Clinical Impression(s) / ED Diagnoses Final diagnoses:  Abscess    Rx / DC Orders ED Discharge Orders         Ordered    doxycycline (VIBRAMYCIN) 100 MG capsule  2 times daily        10/13/20 2013           Rodney Booze, PA-C 10/13/20 2013    Alanna Storti S, PA-C 10/13/20 2355    Quintella Reichert, MD 10/13/20 2356

## 2022-01-04 ENCOUNTER — Other Ambulatory Visit: Payer: Self-pay | Admitting: Obstetrics and Gynecology

## 2022-01-04 DIAGNOSIS — R928 Other abnormal and inconclusive findings on diagnostic imaging of breast: Secondary | ICD-10-CM

## 2022-01-17 ENCOUNTER — Ambulatory Visit
Admission: RE | Admit: 2022-01-17 | Discharge: 2022-01-17 | Disposition: A | Discharge status: Home / Self Care | Payer: Self-pay | Source: Ambulatory Visit | Attending: Obstetrics and Gynecology | Admitting: Obstetrics and Gynecology

## 2022-01-17 ENCOUNTER — Ambulatory Visit
Admission: RE | Admit: 2022-01-17 | Discharge: 2022-01-17 | Disposition: A | Discharge status: Home / Self Care | Payer: PRIVATE HEALTH INSURANCE | Source: Ambulatory Visit | Attending: Obstetrics and Gynecology | Admitting: Obstetrics and Gynecology

## 2022-01-17 DIAGNOSIS — R928 Other abnormal and inconclusive findings on diagnostic imaging of breast: Secondary | ICD-10-CM

## 2022-04-03 ENCOUNTER — Ambulatory Visit: Payer: No Typology Code available for payment source | Admitting: Neurology

## 2022-04-22 ENCOUNTER — Ambulatory Visit (INDEPENDENT_AMBULATORY_CARE_PROVIDER_SITE_OTHER): Payer: No Typology Code available for payment source | Admitting: Neurology

## 2022-04-22 ENCOUNTER — Telehealth: Payer: Self-pay | Admitting: Neurology

## 2022-04-22 ENCOUNTER — Encounter: Payer: Self-pay | Admitting: Neurology

## 2022-04-22 VITALS — BP 124/79 | HR 79 | Ht 62.6 in | Wt 172.5 lb

## 2022-04-22 DIAGNOSIS — R2681 Unsteadiness on feet: Secondary | ICD-10-CM

## 2022-04-22 DIAGNOSIS — R42 Dizziness and giddiness: Secondary | ICD-10-CM

## 2022-04-22 NOTE — Patient Instructions (Signed)
MRI brain to rule out intracranial abnormality If MRI brain is negative, then consider holding Mounjaro to see if the symptoms subside If there is persistence of symptoms despite holding Mounjaro, consider ENT follow-up Continue to follow with PCP Return as needed

## 2022-04-22 NOTE — Progress Notes (Signed)
[facility]  PATIENT: Marie Roberts DOB: Mar 04, 1971  REQUESTING CLINICIAN: Collene Leyden, MD HISTORY FROM: Patient and friend Luellen Pucker  REASON FOR VISIT: Worsening dizziness    HISTORICAL  CHIEF COMPLAINT:  Chief Complaint  Patient presents with   New Patient (Initial Visit)    Rm 13. Accompanied by friend, Luellen Pucker. NP/Paper Proficient/[facility]/Eli Esmeralda Links MD/vertigo.    HISTORY OF PRESENT ILLNESS:  This is a 51 year old woman with past medical history of diabetes mellitus, hypertension, hyperlipidemia, and depression who is presenting with complaint of dizziness since April 2023.  Patient reports the first episode started in April while at work, she was reaching out to the phone and noted that she was about to fall forward.  She reports that every time she reaches for the phone she feel like falling forward but she has not done so.   Since then, it has been worse, in the morning when she gets up at times she cannot get out of the bed; she have to sit at the edge of bed and wait for the episode to pass.  She describes it as a combination of room spinning sensation and unsteadiness. When she drives and the windshield wipers are on, this makes her symptoms worse, at time she feels like the road is folding, even when other cars are passing by her it triggered her symptoms.  This has been very difficult for her so she has not been driving lately.  She denies any headaches, denies any hearing loss but reports intermittent right-sided tinnitus.  She never experienced something similar like this. Her only change that she can think of is in the month of March she started [facility] for her diabetes.    OTHER MEDICAL CONDITIONS: Diabetes Mellitus, Hypertension, hyperlipidemia, Depression,    REVIEW OF SYSTEMS: Full 14 system review of systems performed and negative with exception of: As noted in the HPI   ALLERGIES: Allergies  Allergen Reactions   Canagliflozin      Other reaction(s): increased yeast infection    HOME MEDICATIONS: Outpatient Medications Prior to Visit  Medication Sig Dispense Refill   ALPRAZolam (XANAX) 0.5 MG tablet TAKE 1 TABLET TWICE DAILY AS NEEDED FOR ANXIETY 60 tablet 2   escitalopram (LEXAPRO) 20 MG tablet Take 1 tablet (20 mg total) by mouth daily. 30 tablet 11   fenofibrate (TRICOR) 145 MG tablet Take 145 mg by mouth daily.     meclizine (ANTIVERT) 12.5 MG tablet Take 12.5 mg by mouth 3 (three) times daily as needed for dizziness.     metFORMIN (GLUCOPHAGE) 1000 MG tablet TAKE (1) TABLET TWICE A DAY WITH MEALS (BREAKFAST AND SUPPER) 60 tablet 5   MOUNJARO 7.5 MG/0.5ML Pen Inject 7.5 mg into the skin once a week.     olmesartan (BENICAR) 40 MG tablet Take 20 mg by mouth daily.     ondansetron (ZOFRAN) 4 MG tablet Take 4 mg by mouth every 8 (eight) hours as needed for nausea or vomiting.     rosuvastatin (CRESTOR) 40 MG tablet Take 40 mg by mouth daily.     cetirizine (ZYRTEC) 10 MG tablet Take 1 tablet (10 mg total) by mouth daily. 30 tablet 11   Dulaglutide (TRULICITY) 1.5 DQ/2.2WL SOPN Inject 1.5 mg into the skin once a week. 4 pen 5   fluticasone (FLONASE) 50 MCG/ACT nasal spray Place 2 sprays into both nostrils daily. 16 g 11   hydrochlorothiazide (HYDRODIURIL) 25 MG tablet Take 1 tablet (25 mg total) by mouth  daily. 90 tablet 3   meloxicam (MOBIC) 7.5 MG tablet Take 1 tablet (7.5 mg total) by mouth daily. 30 tablet 5   traMADol (ULTRAM) 50 MG tablet Take 1 tablet (50 mg total) by mouth every 6 (six) hours as needed for moderate pain. 60 tablet 2   traZODone (DESYREL) 50 MG tablet Take 1-2 tablets (50-100 mg total) by mouth at bedtime. 60 tablet 5   No facility-administered medications prior to visit.    PAST MEDICAL HISTORY: Past Medical History:  Diagnosis Date   Cervical cancer ([place])    Diabetes mellitus    High cholesterol    Hypertension     PAST SURGICAL HISTORY: Past Surgical History:  Procedure Laterality  Date   ABDOMINAL HYSTERECTOMY     CARPAL TUNNEL RELEASE     HAND SURGERY      FAMILY HISTORY: Family History  Problem Relation Age of Onset   Diabetes Mother    Diabetes Father     SOCIAL HISTORY: Social History   Socioeconomic History   Marital status: Married    Spouse name: Not on file   Number of children: Not on file   Years of education: Not on file   Highest education level: Not on file  Occupational History   Not on file  Tobacco Use   Smoking status: Former   Smokeless tobacco: Never  Vaping Use   Vaping Use: Never used  Substance and Sexual Activity   Alcohol use: No   Drug use: No   Sexual activity: Not on file  Other Topics Concern   Not on file  Social History Narrative   Not on file   Social Determinants of Health   Financial Resource Strain: Not on file  Food Insecurity: Not on file  Transportation Needs: Not on file  Physical Activity: Not on file  Stress: Not on file  Social Connections: Not on file  Intimate Partner Violence: Not on file    PHYSICAL EXAM  GENERAL EXAM/CONSTITUTIONAL: Vitals:  Vitals:   04/22/22 0944 04/22/22 0946  BP: (!) 147/89 124/79  Pulse: 76 79  Weight: 172 lb 8 oz (78.2 kg)   Height: 5' 2.6" (1.59 m)    Body mass index is 30.95 kg/m. Wt Readings from Last 3 Encounters:  04/22/22 172 lb 8 oz (78.2 kg)  10/13/20 182 lb (82.6 kg)  03/03/18 191 lb (86.6 kg)   Patient is in no distress; well developed, nourished and groomed; neck is supple. Ear canal clear of cerumen impaction  EYES: Pupils round and reactive to light, Visual fields full to confrontation, Extraocular movements intacts,   MUSCULOSKELETAL: Gait, strength, tone, movements noted in Neurologic exam below  NEUROLOGIC: MENTAL STATUS:      No data to display         awake, alert, oriented to person, place and time recent and remote memory intact normal attention and concentration language fluent, comprehension intact, naming intact fund  of knowledge appropriate  CRANIAL NERVE:  2nd, 3rd, 4th, 6th - pupils equal and reactive to light, visual fields full to confrontation, extraocular muscles intact, no nystagmus 5th - facial sensation symmetric 7th - facial strength symmetric 8th - hearing intact 9th - palate elevates symmetrically, uvula midline 11th - shoulder shrug symmetric 12th - tongue protrusion midline  MOTOR:  normal bulk and tone, full strength in the BUE, BLE  SENSORY:  normal and symmetric to light touch, pinprick  COORDINATION:  finger-nose-finger, fine finger movements normal  REFLEXES:  deep tendon  reflexes present and symmetric  GAIT/STATION:  normal   DIAGNOSTIC DATA (LABS, IMAGING, TESTING) - I reviewed patient records, labs, notes, testing and imaging myself where available.  Lab Results  Component Value Date   WBC 8.0 03/03/2018   HGB 13.6 03/03/2018   HCT 40.3 03/03/2018   MCV 88 03/03/2018   PLT 328 03/03/2018      Component Value Date/Time   NA 131 (L) 03/03/2018 0942   K 4.4 03/03/2018 0942   CL 96 03/03/2018 0942   CO2 19 (L) 03/03/2018 0942   GLUCOSE 356 (H) 03/03/2018 0942   GLUCOSE 111 (H) 01/07/2011 1531   BUN 16 03/03/2018 0942   CREATININE 0.68 03/03/2018 0942   CALCIUM 9.7 03/03/2018 0942   PROT 6.7 03/03/2018 0942   ALBUMIN 4.4 03/03/2018 0942   AST 15 03/03/2018 0942   ALT 20 03/03/2018 0942   ALKPHOS 90 03/03/2018 0942   BILITOT 0.3 03/03/2018 0942   GFRNONAA 104 03/03/2018 0942   GFRAA 120 03/03/2018 0942   Lab Results  Component Value Date   CHOL 335 (H) 03/03/2018   HDL 28 (L) 03/03/2018   LDLCALC Comment 03/03/2018   TRIG 1,302 (HH) 03/03/2018   CHOLHDL 12.0 (H) 03/03/2018   Lab Results  Component Value Date   HGBA1C 10.1 (H) 03/03/2018   No results found for: "VITAMINB12" Lab Results  Component Value Date   TSH 1.830 03/03/2018     ASSESSMENT AND PLAN  51 y.o. year old female with history of hypertension, hyperlipidemia, anxiety and  diabetes who is presenting with dizziness, vertigo for the past 16-month and getting worse.  Her neurological examination is nonfocal other than that she has difficulty with tandem gait.  I will start by obtaining a MRI brain to rule out intracranial pathology that can explain her dizziness.  If the MRI brain is negative, then I will recommend patient to hold the MCentracareto see if her symptoms subside, in the case that her symptoms do not resolve after holding Mounjaro, then can consider ENT referral.  Continue to follow with PCP, return as needed. Due to her ongoing symptoms, will give her a 2 weeks off work note, until the  MRI Brain completed.    1. Dizziness   2. Unsteadiness     Patient Instructions  MRI brain to rule out intracranial abnormality If MRI brain is negative, then consider holding Mounjaro to see if the symptoms subside If there is persistence of symptoms despite holding Mounjaro, consider ENT follow-up Continue to follow with PCP Return as needed    Orders Placed This Encounter  Procedures   MR BRAIN WO CONTRAST    No orders of the defined types were placed in this encounter.   Return if symptoms worsen or fail to improve.  I have spent a total of 65 minutes dedicated to this patient today, preparing to see patient, performing a medically appropriate examination and evaluation, ordering tests and/or medications and procedures, and counseling and educating the patient/family/caregiver; independently interpreting result and communicating results to the family/patient/caregiver; and documenting clinical information in the electronic medical record.   AAlric Ran MD 04/22/2022, 12:59 PM  [facility] [address] [place] [phone]

## 2022-04-22 NOTE — Telephone Encounter (Signed)
[employer] sent to GI they obtain auth

## 2022-04-27 ENCOUNTER — Other Ambulatory Visit: Payer: PRIVATE HEALTH INSURANCE

## 2022-04-28 ENCOUNTER — Other Ambulatory Visit: Payer: PRIVATE HEALTH INSURANCE

## 2022-04-30 ENCOUNTER — Other Ambulatory Visit: Payer: PRIVATE HEALTH INSURANCE

## 2022-05-05 ENCOUNTER — Other Ambulatory Visit: Payer: PRIVATE HEALTH INSURANCE

## 2022-05-05 ENCOUNTER — Ambulatory Visit
Admission: RE | Admit: 2022-05-05 | Discharge: 2022-05-05 | Disposition: A | Discharge status: Home / Self Care | Payer: PRIVATE HEALTH INSURANCE | Source: Ambulatory Visit | Attending: Neurology | Admitting: Neurology

## 2022-05-05 DIAGNOSIS — R42 Dizziness and giddiness: Secondary | ICD-10-CM

## 2022-10-10 IMAGING — MG MM DIGITAL DIAGNOSTIC UNILAT*L* W/ TOMO W/ CAD
8 series · 8 of 24 positions shown · non-contrast
Comparison: Previous exam(s).

CLINICAL DATA: Patient returns after screening study for evaluation
possible LEFT breast mass.

EXAM:
DIGITAL DIAGNOSTIC UNILATERAL LEFT MAMMOGRAM WITH TOMOSYNTHESIS AND
CAD; ULTRASOUND LEFT BREAST LIMITED
TECHNIQUE: Left digital diagnostic mammography and breast tomosynthesis was
performed. The images were evaluated with computer-aided detection.;
Targeted ultrasound examination of the left breast was performed.

[L MLO synth-2D (1 of 2)]
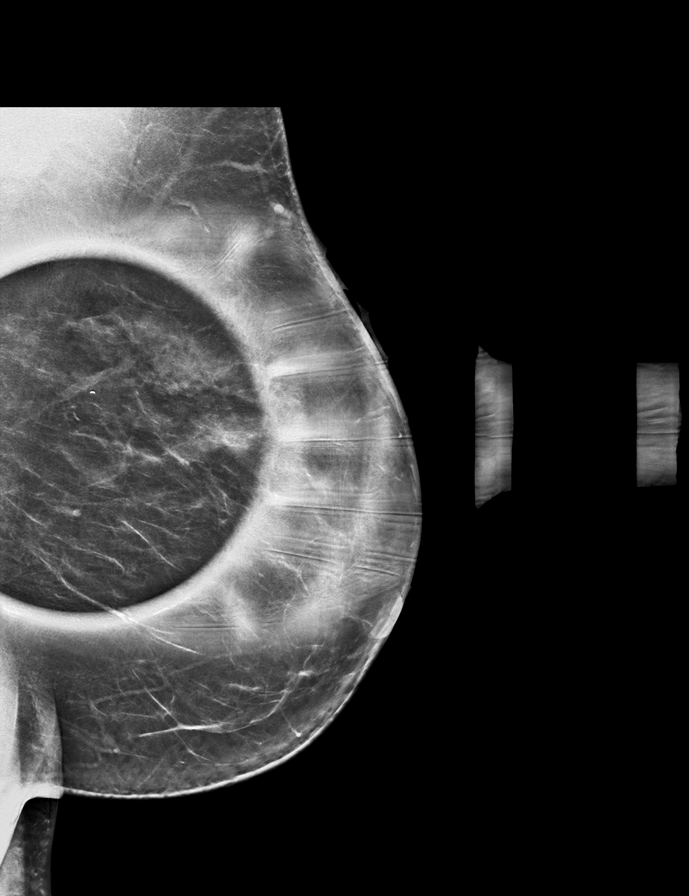

[L CC synth-2D]
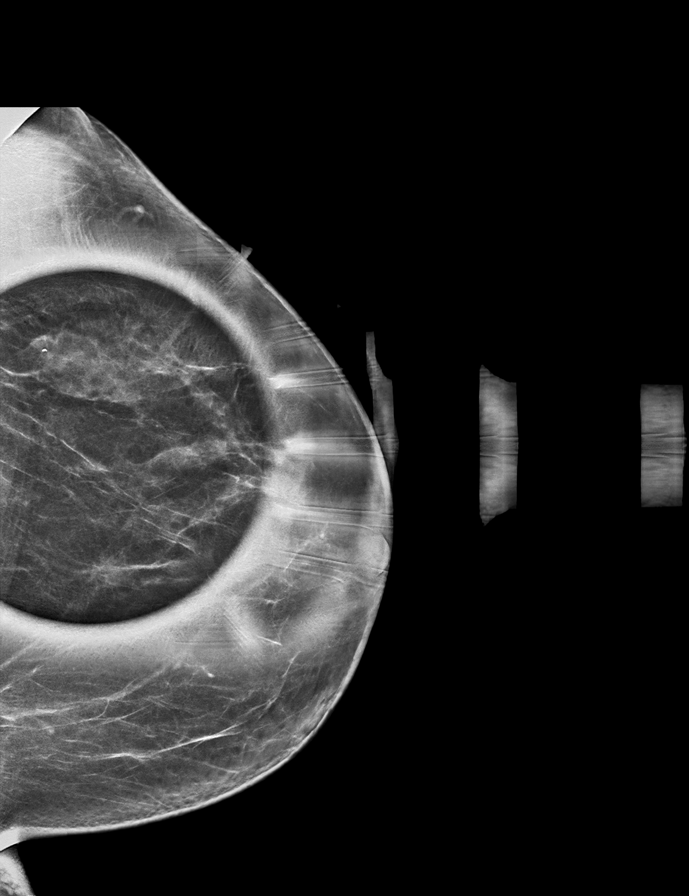

[L MLO synth-2D (2 of 2)]
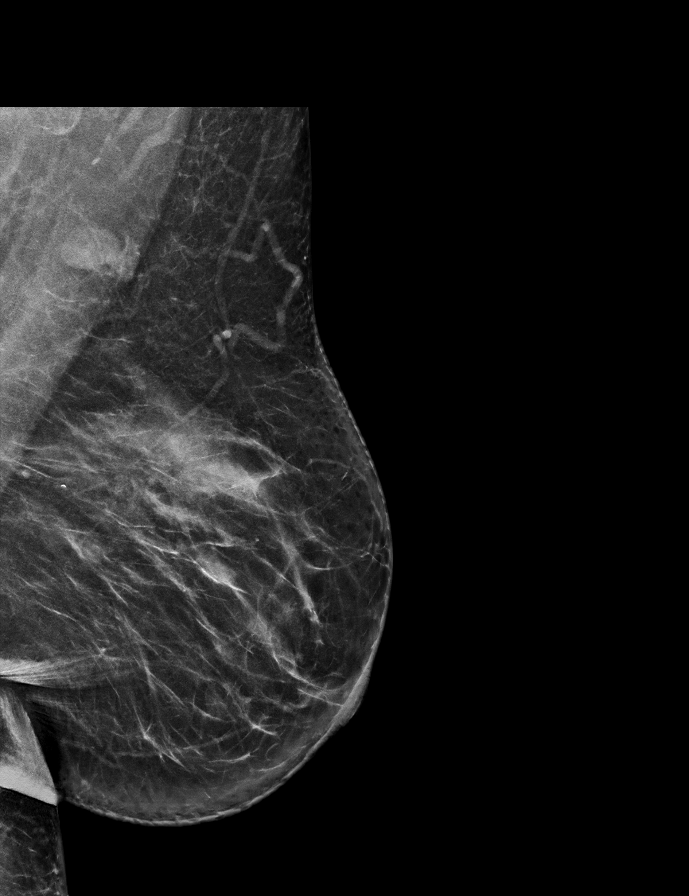

[L XCCL synth-2D]
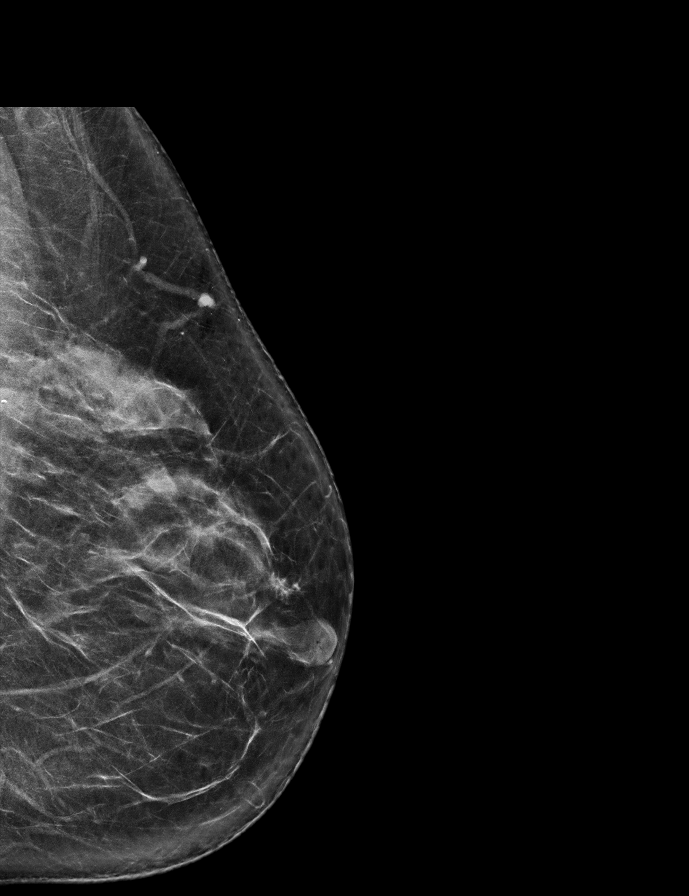

[L MLO tomo (1 of 2) · tomo slice 37/73.0]
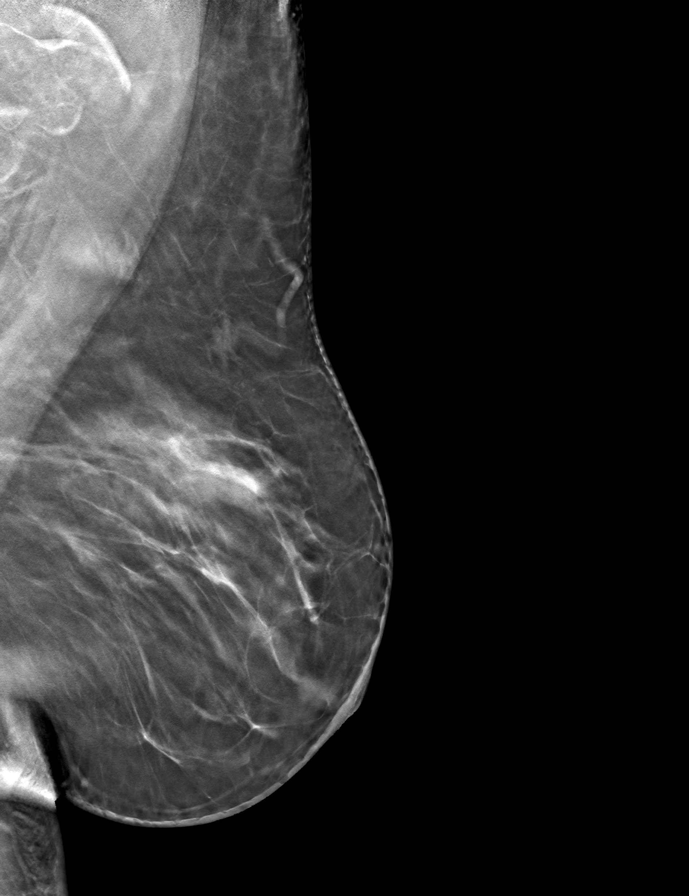

[L XCCL tomo · tomo slice 35/68.0]
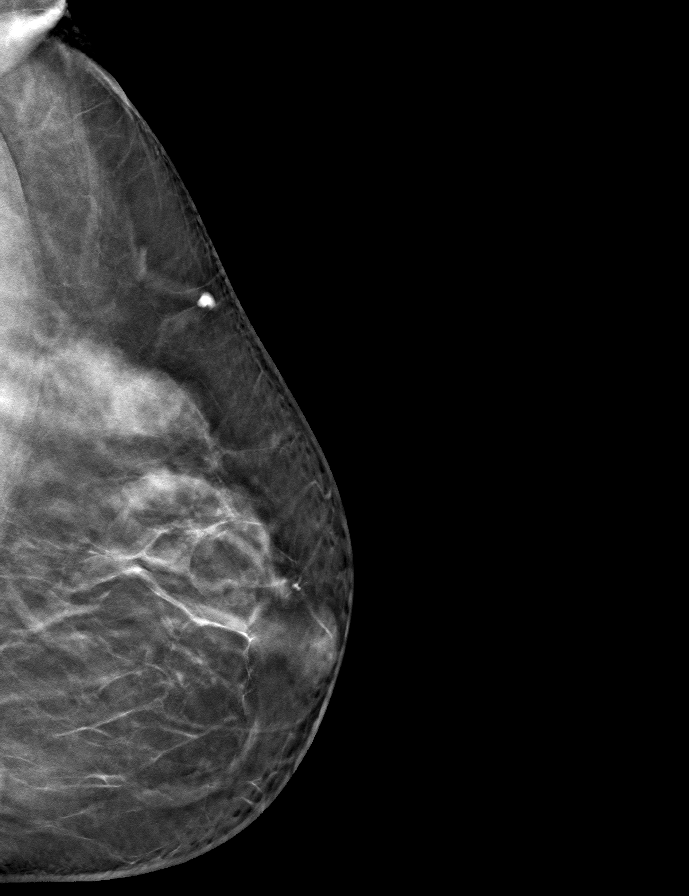

[L MLO tomo (2 of 2) · tomo slice 33/64.0]
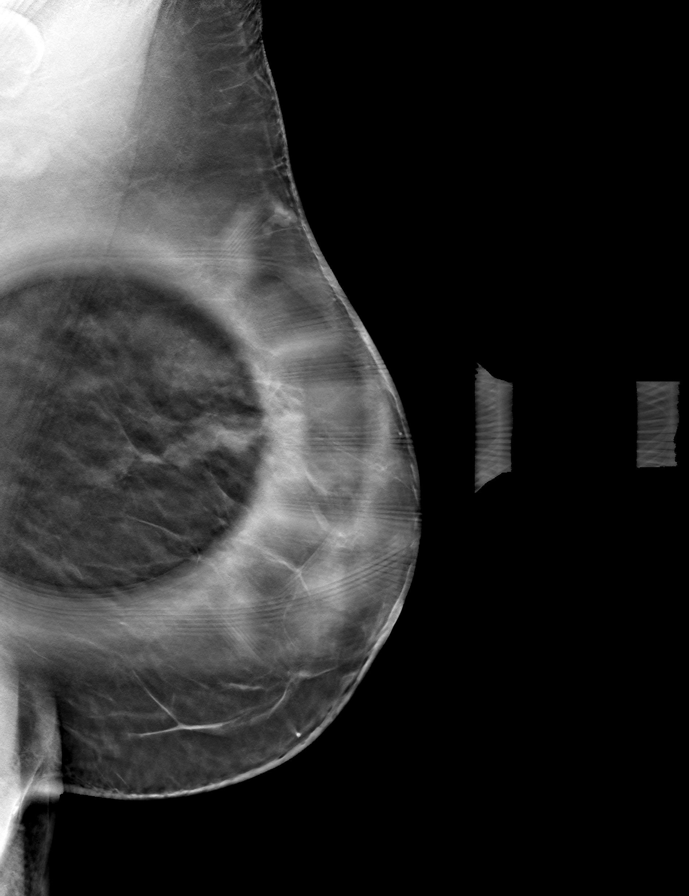

[L CC tomo · tomo slice 33/64.0]
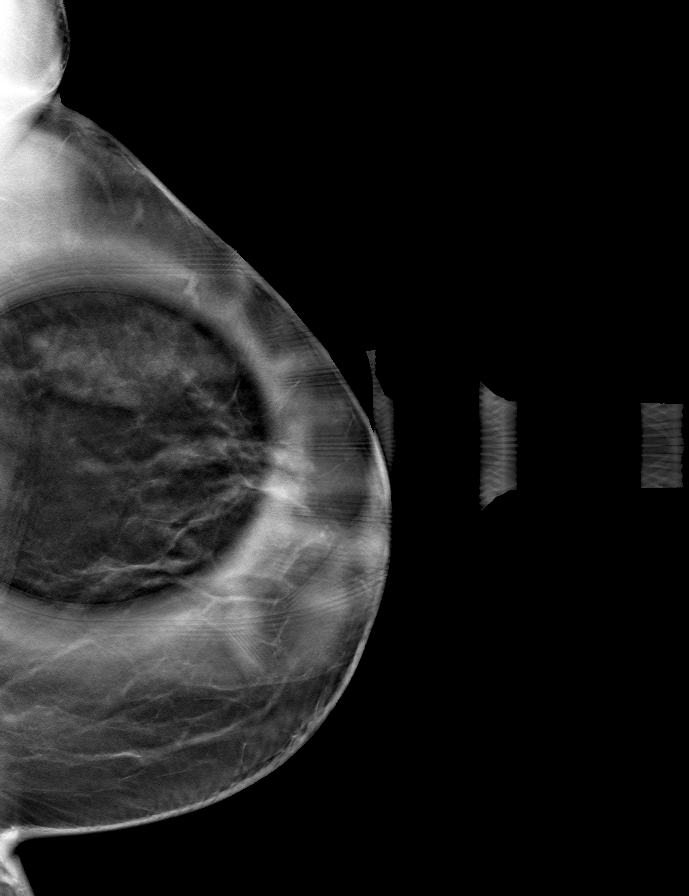

[8 of 24 positions shown; findings below may reference images not displayed]

ACR Breast Density Category c: The breast tissue is heterogeneously
dense, which may obscure small masses.
FINDINGS: Additional 2-D and 3-D images are performed. These views show dense
fibroglandular tissue possible partially obscured mass in the
LATERAL portion of the LEFT breast.

On physical exam, I palpate no abnormality in the LATERAL portions
of the LEFT breast.

Targeted ultrasound is performed, showing dense fibroglandular
tissue in the LATERAL aspects of the LEFT breast. A small cyst is
identified within a fibroglandular ridge in the 2 o'clock location
and measures 0.3 x 0.5 x 0.2 centimeters, correlating well with the
mammographic appearance. The remainder of the LATERAL aspect of the
LEFT breast is unremarkable.
IMPRESSION: No mammographic or ultrasound evidence for malignancy.

RECOMMENDATION:
Screening mammogram in one year.(Code:UT-[id number])

I have discussed the findings and recommendations with the patient.
If applicable, a reminder letter will be sent to the patient
regarding the next appointment.

BI-RADS CATEGORY  1: Negative.

## 2022-12-25 DIAGNOSIS — E781 Pure hyperglyceridemia: Secondary | ICD-10-CM | POA: Diagnosis not present

## 2022-12-25 DIAGNOSIS — E119 Type 2 diabetes mellitus without complications: Secondary | ICD-10-CM | POA: Diagnosis not present

## 2022-12-25 DIAGNOSIS — E785 Hyperlipidemia, unspecified: Secondary | ICD-10-CM | POA: Diagnosis not present

## 2023-01-06 DIAGNOSIS — Z01419 Encounter for gynecological examination (general) (routine) without abnormal findings: Secondary | ICD-10-CM | POA: Diagnosis not present

## 2023-01-06 DIAGNOSIS — Z1231 Encounter for screening mammogram for malignant neoplasm of breast: Secondary | ICD-10-CM | POA: Diagnosis not present

## 2023-01-06 DIAGNOSIS — Z113 Encounter for screening for infections with a predominantly sexual mode of transmission: Secondary | ICD-10-CM | POA: Diagnosis not present

## 2023-01-28 ENCOUNTER — Other Ambulatory Visit (HOSPITAL_COMMUNITY): Payer: Self-pay

## 2023-01-28 MED ORDER — ALPRAZOLAM 0.5 MG PO TABS
0.5000 mg | ORAL_TABLET | Freq: Every day | ORAL | 2 refills | Status: DC
  Filled 2023-02-28: qty 30, 30d supply, fill #0
  Filled 2023-04-10 – 2023-04-25 (×2): qty 30, 30d supply, fill #1

## 2023-01-28 MED ORDER — IBUPROFEN 800 MG PO TABS
800.0000 mg | ORAL_TABLET | Freq: Three times a day (TID) | ORAL | 2 refills | Status: AC
  Filled 2023-01-28 – 2023-02-07 (×2): qty 90, 30d supply, fill #0
  Filled 2023-04-03: qty 90, 30d supply, fill #1
  Filled 2023-06-21: qty 90, 30d supply, fill #0

## 2023-01-28 MED ORDER — OLMESARTAN MEDOXOMIL 20 MG PO TABS
20.0000 mg | ORAL_TABLET | Freq: Every day | ORAL | 3 refills | Status: DC
  Filled 2023-01-28: qty 90, 90d supply, fill #0

## 2023-01-28 MED ORDER — FENOFIBRATE 145 MG PO TABS
145.0000 mg | ORAL_TABLET | Freq: Every day | ORAL | 5 refills | Status: AC
  Filled 2023-01-28 – 2023-06-10 (×3): qty 30, 30d supply, fill #0
  Filled 2023-07-21: qty 30, 30d supply, fill #1

## 2023-01-28 MED ORDER — ONDANSETRON HCL 4 MG PO TABS
4.0000 mg | ORAL_TABLET | ORAL | 5 refills | Status: AC | PRN
  Filled 2023-01-28 – 2023-03-04 (×2): qty 15, 3d supply, fill #0
  Filled 2023-05-07: qty 15, 3d supply, fill #1
  Filled 2023-06-21: qty 15, 3d supply, fill #0

## 2023-01-28 MED ORDER — IPRATROPIUM BROMIDE 0.03 % NA SOLN
2.0000 | NASAL | 5 refills | Status: AC
  Filled 2023-01-28: qty 30, 30d supply, fill #0

## 2023-01-28 MED ORDER — FLUCONAZOLE 150 MG PO TABS
150.0000 mg | ORAL_TABLET | Freq: Every day | ORAL | 2 refills | Status: AC
  Filled 2023-01-28: qty 2, 2d supply, fill #0

## 2023-01-28 MED ORDER — MECLIZINE HCL 12.5 MG PO TABS
12.5000 mg | ORAL_TABLET | Freq: Two times a day (BID) | ORAL | 0 refills | Status: AC | PRN
  Filled 2023-01-28: qty 20, 10d supply, fill #0

## 2023-01-28 MED ORDER — INSULIN GLARGINE 100 UNIT/ML SOLOSTAR PEN
10.0000 [IU] | PEN_INJECTOR | Freq: Every day | SUBCUTANEOUS | 2 refills | Status: AC
  Filled 2023-01-28: qty 15, 90d supply, fill #0

## 2023-01-28 MED ORDER — MOUNJARO 5 MG/0.5ML ~~LOC~~ SOAJ
5.0000 mg | SUBCUTANEOUS | 1 refills | Status: AC
  Filled 2023-01-28: qty 2, 28d supply, fill #0

## 2023-01-28 MED ORDER — METFORMIN HCL 1000 MG PO TABS
1000.0000 mg | ORAL_TABLET | Freq: Two times a day (BID) | ORAL | 0 refills | Status: AC
  Filled 2023-03-22: qty 180, 90d supply, fill #0

## 2023-01-28 MED ORDER — FENOFIBRATE 145 MG PO TABS
145.0000 mg | ORAL_TABLET | Freq: Every day | ORAL | 2 refills | Status: AC
  Filled 2023-02-03 – 2023-02-07 (×2): qty 30, 30d supply, fill #0
  Filled 2023-03-22 (×2): qty 30, 30d supply, fill #1
  Filled 2023-04-25: qty 30, 30d supply, fill #2

## 2023-01-28 MED ORDER — METFORMIN HCL 500 MG PO TABS
500.0000 mg | ORAL_TABLET | Freq: Two times a day (BID) | ORAL | 2 refills | Status: AC
  Filled 2023-01-28: qty 60, 30d supply, fill #0

## 2023-01-28 MED ORDER — DEXCOM G7 SENSOR MISC
3 refills | Status: AC
  Filled 2023-02-03: qty 3, 30d supply, fill #0
  Filled 2023-03-22: qty 3, 30d supply, fill #1
  Filled 2023-06-21: qty 3, 30d supply, fill #0

## 2023-01-28 MED ORDER — MOUNJARO 7.5 MG/0.5ML ~~LOC~~ SOAJ
7.5000 mg | SUBCUTANEOUS | 1 refills | Status: DC
  Filled 2023-02-03 – 2023-02-07 (×2): qty 2, 28d supply, fill #0

## 2023-01-28 MED ORDER — ROSUVASTATIN CALCIUM 40 MG PO TABS
40.0000 mg | ORAL_TABLET | Freq: Every day | ORAL | 1 refills | Status: AC
  Filled 2023-01-28 – 2023-06-10 (×2): qty 90, 90d supply, fill #0

## 2023-01-28 MED ORDER — ESCITALOPRAM OXALATE 20 MG PO TABS
20.0000 mg | ORAL_TABLET | Freq: Every day | ORAL | 3 refills | Status: AC
  Filled 2023-01-28: qty 90, 90d supply, fill #0

## 2023-01-28 MED ORDER — CLOTRIMAZOLE-BETAMETHASONE 1-0.05 % EX CREA
TOPICAL_CREAM | CUTANEOUS | 1 refills | Status: AC
  Filled 2023-01-28: qty 15, 10d supply, fill #0

## 2023-01-28 MED ORDER — TECHLITE PEN NEEDLES 31G X 5 MM MISC
3 refills | Status: AC
  Filled 2023-06-10: qty 100, 90d supply, fill #0

## 2023-01-29 ENCOUNTER — Other Ambulatory Visit (HOSPITAL_COMMUNITY): Payer: Self-pay

## 2023-01-30 ENCOUNTER — Other Ambulatory Visit (HOSPITAL_COMMUNITY): Payer: Self-pay

## 2023-01-30 ENCOUNTER — Other Ambulatory Visit: Payer: Self-pay

## 2023-01-31 ENCOUNTER — Other Ambulatory Visit (HOSPITAL_COMMUNITY): Payer: Self-pay

## 2023-02-03 ENCOUNTER — Other Ambulatory Visit (HOSPITAL_BASED_OUTPATIENT_CLINIC_OR_DEPARTMENT_OTHER): Payer: Self-pay

## 2023-02-03 ENCOUNTER — Other Ambulatory Visit: Payer: Self-pay

## 2023-02-06 ENCOUNTER — Other Ambulatory Visit (HOSPITAL_COMMUNITY): Payer: Self-pay

## 2023-02-07 ENCOUNTER — Other Ambulatory Visit (HOSPITAL_BASED_OUTPATIENT_CLINIC_OR_DEPARTMENT_OTHER): Payer: Self-pay

## 2023-02-28 ENCOUNTER — Encounter (HOSPITAL_BASED_OUTPATIENT_CLINIC_OR_DEPARTMENT_OTHER): Payer: Self-pay | Admitting: Pharmacist

## 2023-02-28 ENCOUNTER — Other Ambulatory Visit: Payer: Self-pay

## 2023-02-28 ENCOUNTER — Other Ambulatory Visit (HOSPITAL_BASED_OUTPATIENT_CLINIC_OR_DEPARTMENT_OTHER): Payer: Self-pay

## 2023-02-28 MED ORDER — OLMESARTAN MEDOXOMIL 20 MG PO TABS
20.0000 mg | ORAL_TABLET | Freq: Every day | ORAL | 3 refills | Status: AC
  Filled 2023-02-28: qty 90, 90d supply, fill #0
  Filled 2023-06-10: qty 90, 90d supply, fill #1

## 2023-03-04 ENCOUNTER — Other Ambulatory Visit: Payer: Self-pay

## 2023-03-04 ENCOUNTER — Other Ambulatory Visit (HOSPITAL_COMMUNITY): Payer: Self-pay

## 2023-03-22 ENCOUNTER — Other Ambulatory Visit (HOSPITAL_COMMUNITY): Payer: Self-pay

## 2023-03-22 ENCOUNTER — Other Ambulatory Visit (HOSPITAL_BASED_OUTPATIENT_CLINIC_OR_DEPARTMENT_OTHER): Payer: Self-pay

## 2023-04-03 ENCOUNTER — Other Ambulatory Visit (HOSPITAL_BASED_OUTPATIENT_CLINIC_OR_DEPARTMENT_OTHER): Payer: Self-pay

## 2023-04-03 ENCOUNTER — Other Ambulatory Visit (HOSPITAL_COMMUNITY): Payer: Self-pay

## 2023-04-03 MED ORDER — MOUNJARO 7.5 MG/0.5ML ~~LOC~~ SOAJ
7.5000 mg | SUBCUTANEOUS | 3 refills | Status: AC
  Filled 2023-04-03: qty 2, 28d supply, fill #0
  Filled 2023-04-25 – 2023-04-28 (×2): qty 2, 28d supply, fill #1
  Filled 2023-06-10: qty 2, 28d supply, fill #2
  Filled 2023-07-21: qty 2, 28d supply, fill #3

## 2023-04-10 ENCOUNTER — Other Ambulatory Visit (HOSPITAL_COMMUNITY): Payer: Self-pay

## 2023-04-10 ENCOUNTER — Other Ambulatory Visit: Payer: Self-pay

## 2023-04-10 ENCOUNTER — Other Ambulatory Visit (HOSPITAL_BASED_OUTPATIENT_CLINIC_OR_DEPARTMENT_OTHER): Payer: Self-pay

## 2023-04-18 ENCOUNTER — Other Ambulatory Visit (HOSPITAL_COMMUNITY): Payer: Self-pay

## 2023-04-25 ENCOUNTER — Other Ambulatory Visit (HOSPITAL_BASED_OUTPATIENT_CLINIC_OR_DEPARTMENT_OTHER): Payer: Self-pay

## 2023-04-25 ENCOUNTER — Other Ambulatory Visit (HOSPITAL_COMMUNITY): Payer: Self-pay

## 2023-04-25 ENCOUNTER — Other Ambulatory Visit: Payer: Self-pay

## 2023-04-30 ENCOUNTER — Other Ambulatory Visit (HOSPITAL_COMMUNITY): Payer: Self-pay

## 2023-04-30 ENCOUNTER — Other Ambulatory Visit (HOSPITAL_BASED_OUTPATIENT_CLINIC_OR_DEPARTMENT_OTHER): Payer: Self-pay

## 2023-05-07 ENCOUNTER — Other Ambulatory Visit: Payer: Self-pay

## 2023-06-10 ENCOUNTER — Other Ambulatory Visit (HOSPITAL_BASED_OUTPATIENT_CLINIC_OR_DEPARTMENT_OTHER): Payer: Self-pay

## 2023-06-10 ENCOUNTER — Other Ambulatory Visit (HOSPITAL_COMMUNITY): Payer: Self-pay

## 2023-06-21 ENCOUNTER — Other Ambulatory Visit (HOSPITAL_BASED_OUTPATIENT_CLINIC_OR_DEPARTMENT_OTHER): Payer: Self-pay

## 2023-06-23 ENCOUNTER — Other Ambulatory Visit (HOSPITAL_BASED_OUTPATIENT_CLINIC_OR_DEPARTMENT_OTHER): Payer: Self-pay

## 2023-06-23 MED ORDER — ALPRAZOLAM 0.5 MG PO TABS
0.5000 mg | ORAL_TABLET | Freq: Every day | ORAL | 2 refills | Status: AC
  Filled 2023-06-23: qty 30, 30d supply, fill #0
  Filled 2023-07-21: qty 30, 30d supply, fill #1

## 2023-07-01 ENCOUNTER — Other Ambulatory Visit (HOSPITAL_BASED_OUTPATIENT_CLINIC_OR_DEPARTMENT_OTHER): Payer: Self-pay

## 2023-07-05 ENCOUNTER — Other Ambulatory Visit (HOSPITAL_BASED_OUTPATIENT_CLINIC_OR_DEPARTMENT_OTHER): Payer: Self-pay

## 2023-07-07 ENCOUNTER — Other Ambulatory Visit (HOSPITAL_BASED_OUTPATIENT_CLINIC_OR_DEPARTMENT_OTHER): Payer: Self-pay

## 2023-07-07 MED ORDER — ESCITALOPRAM OXALATE 20 MG PO TABS
20.0000 mg | ORAL_TABLET | Freq: Every day | ORAL | 3 refills | Status: AC
  Filled 2023-07-07: qty 30, 30d supply, fill #0

## 2023-07-15 ENCOUNTER — Other Ambulatory Visit (HOSPITAL_BASED_OUTPATIENT_CLINIC_OR_DEPARTMENT_OTHER): Payer: Self-pay

## 2023-07-15 MED ORDER — MOUNJARO 7.5 MG/0.5ML ~~LOC~~ SOAJ
7.5000 mg | SUBCUTANEOUS | 0 refills | Status: AC
  Filled 2023-07-15: qty 2, 28d supply, fill #0

## 2023-07-21 ENCOUNTER — Other Ambulatory Visit: Payer: Self-pay

## 2023-07-21 ENCOUNTER — Other Ambulatory Visit (HOSPITAL_BASED_OUTPATIENT_CLINIC_OR_DEPARTMENT_OTHER): Payer: Self-pay

## 2023-11-12 ENCOUNTER — Other Ambulatory Visit (HOSPITAL_BASED_OUTPATIENT_CLINIC_OR_DEPARTMENT_OTHER): Payer: Self-pay

## 2023-12-17 ENCOUNTER — Other Ambulatory Visit (HOSPITAL_BASED_OUTPATIENT_CLINIC_OR_DEPARTMENT_OTHER): Payer: Self-pay
